# Patient Record
Sex: Female | Born: 1985
Health system: Southern US, Community
[De-identification: ages and names within clinical notes are randomized; demographics above are authoritative.]

## PROBLEM LIST (undated history)

## (undated) DIAGNOSIS — I10 Essential (primary) hypertension: Secondary | ICD-10-CM

---
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
## 2005-09-27 ENCOUNTER — Emergency Department (HOSPITAL_COMMUNITY): Admission: EM | Admit: 2005-09-27 | Discharge: 2005-09-27 | Payer: Self-pay | Admitting: Family Medicine

## 2005-10-02 ENCOUNTER — Encounter (INDEPENDENT_AMBULATORY_CARE_PROVIDER_SITE_OTHER): Payer: Self-pay | Admitting: *Deleted

## 2005-10-02 LAB — CONVERTED CEMR LAB

## 2005-10-18 ENCOUNTER — Encounter (INDEPENDENT_AMBULATORY_CARE_PROVIDER_SITE_OTHER): Payer: Self-pay | Admitting: *Deleted

## 2005-10-18 ENCOUNTER — Ambulatory Visit: Payer: Self-pay

## 2005-10-18 ENCOUNTER — Ambulatory Visit (HOSPITAL_COMMUNITY): Admission: RE | Admit: 2005-10-18 | Discharge: 2005-10-18 | Payer: Self-pay | Admitting: Family Medicine

## 2005-11-22 ENCOUNTER — Ambulatory Visit: Payer: Self-pay | Admitting: Family Medicine

## 2005-12-21 ENCOUNTER — Emergency Department (HOSPITAL_COMMUNITY): Admission: EM | Admit: 2005-12-21 | Discharge: 2005-12-21 | Payer: Self-pay | Admitting: Emergency Medicine

## 2006-01-10 ENCOUNTER — Emergency Department (HOSPITAL_COMMUNITY): Admission: EM | Admit: 2006-01-10 | Discharge: 2006-01-11 | Payer: Self-pay | Admitting: Emergency Medicine

## 2006-01-10 ENCOUNTER — Emergency Department (HOSPITAL_COMMUNITY): Admission: EM | Admit: 2006-01-10 | Discharge: 2006-01-10 | Payer: Self-pay | Admitting: Emergency Medicine

## 2006-01-18 ENCOUNTER — Emergency Department (HOSPITAL_COMMUNITY): Admission: EM | Admit: 2006-01-18 | Discharge: 2006-01-19 | Payer: Self-pay | Admitting: Emergency Medicine

## 2006-06-01 DIAGNOSIS — E669 Obesity, unspecified: Secondary | ICD-10-CM

## 2006-06-01 DIAGNOSIS — Z87891 Personal history of nicotine dependence: Secondary | ICD-10-CM

## 2006-06-01 DIAGNOSIS — L2089 Other atopic dermatitis: Secondary | ICD-10-CM | POA: Insufficient documentation

## 2006-06-02 ENCOUNTER — Encounter (INDEPENDENT_AMBULATORY_CARE_PROVIDER_SITE_OTHER): Payer: Self-pay | Admitting: *Deleted

## 2007-01-30 ENCOUNTER — Encounter: Payer: Self-pay | Admitting: *Deleted

## 2007-10-12 ENCOUNTER — Emergency Department (HOSPITAL_COMMUNITY): Admission: EM | Admit: 2007-10-12 | Discharge: 2007-10-12 | Payer: Self-pay | Admitting: Emergency Medicine

## 2008-09-11 ENCOUNTER — Emergency Department (HOSPITAL_COMMUNITY): Admission: EM | Admit: 2008-09-11 | Discharge: 2008-09-11 | Payer: Self-pay | Admitting: Emergency Medicine

## 2010-05-20 ENCOUNTER — Emergency Department (HOSPITAL_COMMUNITY)
Admission: EM | Admit: 2010-05-20 | Discharge: 2010-05-21 | Disposition: A | Discharge status: Home / Self Care | Payer: Self-pay | Attending: Emergency Medicine | Admitting: Emergency Medicine

## 2010-05-20 DIAGNOSIS — R630 Anorexia: Secondary | ICD-10-CM | POA: Insufficient documentation

## 2010-05-20 DIAGNOSIS — E669 Obesity, unspecified: Secondary | ICD-10-CM | POA: Insufficient documentation

## 2010-05-20 DIAGNOSIS — R51 Headache: Secondary | ICD-10-CM | POA: Insufficient documentation

## 2010-05-20 DIAGNOSIS — H53149 Visual discomfort, unspecified: Secondary | ICD-10-CM | POA: Insufficient documentation

## 2010-05-20 DIAGNOSIS — R42 Dizziness and giddiness: Secondary | ICD-10-CM | POA: Insufficient documentation

## 2010-05-20 DIAGNOSIS — R110 Nausea: Secondary | ICD-10-CM | POA: Insufficient documentation

## 2010-05-26 ENCOUNTER — Encounter: Payer: Self-pay | Admitting: *Deleted

## 2011-12-19 ENCOUNTER — Emergency Department (HOSPITAL_COMMUNITY): Payer: Self-pay

## 2011-12-19 ENCOUNTER — Encounter (HOSPITAL_COMMUNITY): Payer: Self-pay | Admitting: *Deleted

## 2011-12-19 ENCOUNTER — Emergency Department (HOSPITAL_COMMUNITY): Admission: EM | Admit: 2011-12-19 | Discharge: 2011-12-19 | Discharge status: Absent without leave | Payer: Self-pay

## 2011-12-19 ENCOUNTER — Emergency Department (HOSPITAL_COMMUNITY)
Admission: EM | Admit: 2011-12-19 | Discharge: 2011-12-19 | Disposition: A | Discharge status: Home / Self Care | Payer: Self-pay | Attending: Emergency Medicine | Admitting: Emergency Medicine

## 2011-12-19 DIAGNOSIS — J069 Acute upper respiratory infection, unspecified: Secondary | ICD-10-CM

## 2011-12-19 DIAGNOSIS — J189 Pneumonia, unspecified organism: Secondary | ICD-10-CM | POA: Insufficient documentation

## 2011-12-19 DIAGNOSIS — Z87891 Personal history of nicotine dependence: Secondary | ICD-10-CM | POA: Insufficient documentation

## 2011-12-19 MED ORDER — IPRATROPIUM BROMIDE 0.02 % IN SOLN
0.5000 mg | Freq: Once | RESPIRATORY_TRACT | Status: AC
  Administered 2011-12-19: 0.5 mg via RESPIRATORY_TRACT
  Filled 2011-12-19: qty 2.5

## 2011-12-19 MED ORDER — ALBUTEROL SULFATE HFA 108 (90 BASE) MCG/ACT IN AERS
2.0000 | INHALATION_SPRAY | RESPIRATORY_TRACT | Status: AC
  Administered 2011-12-19: 2 via RESPIRATORY_TRACT
  Filled 2011-12-19: qty 6.7

## 2011-12-19 MED ORDER — AZITHROMYCIN 250 MG PO TABS: ORAL_TABLET | ORAL | Status: DC

## 2011-12-19 MED ORDER — ALBUTEROL SULFATE (5 MG/ML) 0.5% IN NEBU
5.0000 mg | INHALATION_SOLUTION | Freq: Once | RESPIRATORY_TRACT | Status: AC
  Administered 2011-12-19: 5 mg via RESPIRATORY_TRACT
  Filled 2011-12-19: qty 1

## 2011-12-19 NOTE — ED Notes (Signed)
Did not answer x 1 per xray

## 2011-12-19 NOTE — ED Notes (Signed)
Pt reports headache, strong dry cough, and fevers since Friday. Denies sob. States she feels like she has been wheezing however, denies hx of asthma.

## 2011-12-19 NOTE — ED Notes (Signed)
Pt c/o headache, sore thorat since weekend, productive cough, cough more when lying flat, c/o chest pain, states "rattling" in ches

## 2011-12-19 NOTE — ED Provider Notes (Signed)
History  This chart was scribed for Laray Anger, DO by Shari Heritage. The patient was seen in room TR05C/TR05C. Patient's care was started at 1517.     CSN: [id number]  Arrival date & time 12/19/11  1310   First MD Initiated Contact with Patient 12/19/11 1517      Chief Complaint  Patient presents with  . Cough  . Headache  . Fever    The history is provided by the patient. No language interpreter was used.    Kathryn Butler is a 26 y.o. female who presents to the Emergency Department complaining of gradual onset and persistence of constant non-productive cough onset 2 days ago. There is associated nasal congestion, rhinorrhea, "wheezing," and subjective home fevers/chills. Patient says other people in her home are sick also.  Denies rash, no CP/SOB, no abd pain, no N/V/D.     History reviewed. No pertinent past medical history.  History reviewed. No pertinent past surgical history.   History  Substance Use Topics  . Smoking status: Former Games developer  . Smokeless tobacco: Not on file  . Alcohol Use: No      Review of Systems ROS: Statement: All systems negative except as marked or noted in the HPI; Constitutional: +chills. ; ; Eyes: Negative for eye pain, redness and discharge. ; ; ENMT: Negative for ear pain, hoarseness, sore throat. +runny nose, nasal congestion, sinus pressure. ; ; Cardiovascular: Negative for chest pain, palpitations, diaphoresis, dyspnea and peripheral edema. ; ; Respiratory: +cough, wheezing. Negative for stridor. ; ; Gastrointestinal: Negative for nausea, vomiting, diarrhea, abdominal pain, blood in stool, hematemesis, jaundice and rectal bleeding. . ; ; Genitourinary: Negative for dysuria, flank pain and hematuria. ; ; Musculoskeletal: Negative for back pain and neck pain. Negative for swelling and trauma.; ; Skin: Negative for pruritus, rash, abrasions, blisters, bruising and skin lesion.; ; Neuro: Negative for headache, lightheadedness and neck  stiffness. Negative for weakness, altered level of consciousness , altered mental status, extremity weakness, paresthesias, involuntary movement, seizure and syncope.       Allergies  Review of patient's allergies indicates no known allergies.  Home Medications   Current Outpatient Rx  Name Route Sig Dispense Refill  . AZITHROMYCIN 250 MG PO TABS  Take 2 tablets PO day one, then 1 tab PO daily for the next 4 days. 6 tablet 0    BP 147/103  Pulse 106  Temp 99.1 F (37.3 C)  Resp 18  SpO2 97%  LMP 12/04/2011  Physical Exam 1520: Physical examination:  Nursing notes reviewed; Vital signs and O2 SAT reviewed;  Constitutional: Well developed, Well nourished, Well hydrated, In no acute distress; Head:  Normocephalic, atraumatic; Eyes: EOMI, PERRL, No scleral icterus; ENMT: TM's clear bilat, +edemetous nasal turbinates bilat with clear rhinorrhea.  No hoarse voice, no drooling, no stridor. Mouth and pharynx normal, Mucous membranes moist; Neck: Supple, Full range of motion, No lymphadenopathy; Cardiovascular: Regular rate and rhythm, No murmur, rub, or gallop; Respiratory: Breath sounds coarse & equal bilaterally, scattered wheezes. No audible wheezing. Speaking full sentences with ease, Normal respiratory effort/excursion; Chest: Nontender, Movement normal; Abdomen: Soft, Nontender, Nondistended, Normal bowel sounds;; Extremities: Pulses normal, No tenderness, No edema, No calf edema or asymmetry.; Neuro: AA&Ox3, Major CN grossly intact.  Speech clear. No gross focal motor or sensory deficits in extremities.; Skin: Color normal, Warm, Dry.   ED Course  Procedures    MDM  MDM Reviewed: nursing note and vitals Interpretation: x-ray     Dg Chest  2 View 12/19/2011  *RADIOLOGY REPORT*  Clinical Data: Cough, fever.  CHEST - 2 VIEW  Comparison: None.  Findings: Heart size upper normal. Mild hypoaeration with interstitial and vascular crowding.  Lingular opacity on the lateral view has a  triangular configuration which raises possibility of superimposed artifact. Otherwise no focal consolidation, pleural effusion, or pneumothorax.  No acute osseous finding.  IMPRESSION: Opacity projecting over the lingula on the lateral view may be artifactual or early infiltrate.   Original Report Authenticated By: Waneta Martins, M.D.      [phone]:  Feels better after neb and wants to go home now.  Ambulatory around the ED with easy resps, lungs CTA bilat, Sats 97% R/A.  Will dose MDI here, rx zithomax for possible CAP.  Dx testing d/w pt and family.  Questions answered.  Verb understanding, agreeable to d/c home with outpt f/u.     I personally performed the services described in this documentation, which was scribed in my presence. The recorded information has been reviewed and considered. Seymour Pavlak Allison Quarry, DO 12/21/11 1308

## 2012-12-20 ENCOUNTER — Encounter (HOSPITAL_COMMUNITY): Payer: Self-pay

## 2012-12-20 ENCOUNTER — Emergency Department (HOSPITAL_COMMUNITY)
Admission: EM | Admit: 2012-12-20 | Discharge: 2012-12-20 | Disposition: A | Discharge status: Home / Self Care | Payer: Self-pay | Attending: Emergency Medicine | Admitting: Emergency Medicine

## 2012-12-20 DIAGNOSIS — Z87891 Personal history of nicotine dependence: Secondary | ICD-10-CM | POA: Insufficient documentation

## 2012-12-20 DIAGNOSIS — J020 Streptococcal pharyngitis: Secondary | ICD-10-CM | POA: Insufficient documentation

## 2012-12-20 DIAGNOSIS — R509 Fever, unspecified: Secondary | ICD-10-CM | POA: Insufficient documentation

## 2012-12-20 LAB — RAPID STREP SCREEN (MED CTR MEBANE ONLY): Streptococcus, Group A Screen (Direct): POSITIVE — AB

## 2012-12-20 MED ORDER — PENICILLIN G BENZATHINE 1200000 UNIT/2ML IM SUSP
1.2000 10*6.[IU] | Freq: Once | INTRAMUSCULAR | Status: AC
  Administered 2012-12-20: 1.2 10*6.[IU] via INTRAMUSCULAR
  Filled 2012-12-20: qty 2

## 2012-12-20 MED ORDER — DEXAMETHASONE 6 MG PO TABS
10.0000 mg | ORAL_TABLET | Freq: Once | ORAL | Status: AC
  Administered 2012-12-20: 10 mg via ORAL
  Filled 2012-12-20 (×3): qty 1

## 2012-12-20 MED ORDER — IBUPROFEN 800 MG PO TABS
800.0000 mg | ORAL_TABLET | Freq: Once | ORAL | Status: AC
  Administered 2012-12-20: 800 mg via ORAL
  Filled 2012-12-20: qty 1

## 2012-12-20 NOTE — ED Notes (Signed)
Pt ambulated without any assistance to the bathroom

## 2012-12-20 NOTE — ED Provider Notes (Signed)
CSN: [id number]     Arrival date & time 12/20/12  0815 History   First MD Initiated Contact with Patient 12/20/12 [phone]     Chief Complaint  Patient presents with  . Sore Throat   (Consider location/radiation/quality/duration/timing/severity/associated sxs/prior Treatment) Patient is a 27 y.o. female presenting with pharyngitis. The history is provided by the patient.  Sore Throat This is a new problem. The current episode started in the past 7 days. The problem occurs constantly. The problem has been gradually worsening. Associated symptoms include coughing (Rarely.  She reports one episode of coughing up blood-streaked mucus), a fever (subject) and a sore throat. Pertinent negatives include no abdominal pain, chest pain, chills, fatigue, headaches, nausea, neck pain, rash, swollen glands or vomiting. Treatments tried: Robitussin. The treatment provided mild relief.    History reviewed. No pertinent past medical history.  History reviewed. No pertinent past surgical history.  No family history on file. History  Substance Use Topics  . Smoking status: Former Games developer  . Smokeless tobacco: Not on file  . Alcohol Use: No   OB History   Grav Para Term Preterm Abortions TAB SAB Ect Mult Living                 Review of Systems  Constitutional: Positive for fever (subject). Negative for chills and fatigue.  HENT: Positive for sore throat. Negative for neck pain.   Eyes: Negative for pain.  Respiratory: Positive for cough (Rarely.  She reports one episode of coughing up blood-streaked mucus). Negative for chest tightness.   Cardiovascular: Negative for chest pain.  Gastrointestinal: Negative for nausea, vomiting and abdominal pain.  Genitourinary: Negative for dysuria, frequency, vaginal bleeding and vaginal discharge.  Musculoskeletal: Negative for back pain.  Skin: Negative for rash.  Neurological: Negative for headaches.  All other systems reviewed and are negative.    Allergies   Review of patient's allergies indicates no known allergies.  Home Medications   Current Outpatient Rx  Name  Route  Sig  Dispense  Refill  . guaifenesin (TUSSIN) 100 MG/5ML syrup   Oral   Take 200 mg by mouth 3 (three) times daily as needed for cough.          BP 131/74  Pulse 97  Temp(Src) 98.1 F (36.7 C) (Oral)  Resp 16  Ht 5\' 5"  (1.651 m)  Wt 180 lb (81.647 kg)  BMI 29.95 kg/m2  SpO2 100%  LMP 11/26/2012 Physical Exam  Vitals reviewed. Constitutional: She is oriented to person, place, and time. She appears well-developed and well-nourished. No distress.  HENT:  Right Ear: External ear normal.  Left Ear: External ear normal.  Mouth/Throat: No oropharyngeal exudate.  Posterior pharyngeal erythema, scant exudates  Eyes: Conjunctivae and EOM are normal. Pupils are equal, round, and reactive to light.  Neck: Normal range of motion. Neck supple.  Cardiovascular: Normal rate, regular rhythm, normal heart sounds and intact distal pulses.  Exam reveals no gallop and no friction rub.   No murmur heard. Pulmonary/Chest: Effort normal and breath sounds normal.  Abdominal: Soft. Bowel sounds are normal. She exhibits no distension. There is no tenderness.  Musculoskeletal: Normal range of motion. She exhibits no edema.  Lymphadenopathy:    She has no cervical adenopathy.  Neurological: She is alert and oriented to person, place, and time.  Skin: Skin is warm and dry. No rash noted.  Psychiatric: She has a normal mood and affect.    ED Course  Procedures (including critical care time) Labs  Review Labs Reviewed  RAPID STREP SCREEN - Abnormal; Notable for the following:    Streptococcus, Group A Screen (Direct) POSITIVE (*)    All other components within normal limits   Imaging Review No results found.  MDM   27 year female here here with cold symptoms for a week and a main complaint of worsening sore throat. She tried Robitussin with minimal relief. She endorses pain  with swallowing, but is able to swallow easily. She also had one episode of coughing up mucous that was streaked with a little bit of blood. She has had multiple sick contacts. She endorses subjective fevers, but none confirmed. She's also had a couple episodes of loose stools. She is afebrile and well appearing. Exam is as documented above. Full and unrestricted active range of motion of her neck.  Given her myriad of symptoms is felt this is likely a viral process. Centor 2/4 for no cough and scant exudates.  No signs of PTA or retropharyngeal abscess.  Will swab for strep. Motrin, decadron, reassess.  12:25 PM Strep swab was positive. Bicillin given.  Supportive care discussed. Return precautions reviewed. All questions were answered.  Clinical Impression: 1. Strep pharyngitis     Disposition: Discharge  Condition: Good  I have discussed the results, Dx and Tx plan. They expressed understanding and agree with the plan and were told to return to ED with any worsening of condition or concern.    Discharge Medication List as of 12/20/2012 11:35 AM      Follow Up: Mirna Mires, MD [address] [place] [address] [phone]  Schedule an appointment as soon as possible for a visit    Pt seen in conjunction with Dr. Oletta Lamas.  Reine Just. Beverely Pace, MD Emergency Medicine PGY-III [phone]    Oleh Genin, MD 12/20/12 [phone]

## 2012-12-20 NOTE — ED Notes (Signed)
Pt here for sore throat since last Wednesday. Denies any fever. Red throat enlarged tonsils. Has tried multiple OTC remedies without relief.

## 2012-12-22 NOTE — ED Provider Notes (Signed)
I saw and evaluated the patient, reviewed the resident's note and I agree with the findings and plan.  Subjective fever at home, sore throat.  No sig lymphadenopathy.  No sig coughing.  Hoarse voice, pain with swallowing and speaking . Strep screen is positive.  Protecting airway, no stridor, wheezing.  Will gvie abx, steroids for pain and swelling, safe for d/c home.       Gavin Pound. Ardella Chhim, MD 12/22/12 1478

## 2014-09-24 ENCOUNTER — Emergency Department (HOSPITAL_COMMUNITY): Payer: Self-pay

## 2014-09-24 ENCOUNTER — Emergency Department (HOSPITAL_COMMUNITY)
Admission: EM | Admit: 2014-09-24 | Discharge: 2014-09-24 | Disposition: A | Discharge status: Home / Self Care | Payer: Self-pay | Attending: Emergency Medicine | Admitting: Emergency Medicine

## 2014-09-24 ENCOUNTER — Encounter (HOSPITAL_COMMUNITY): Payer: Self-pay | Admitting: *Deleted

## 2014-09-24 DIAGNOSIS — W01198A Fall on same level from slipping, tripping and stumbling with subsequent striking against other object, initial encounter: Secondary | ICD-10-CM | POA: Insufficient documentation

## 2014-09-24 DIAGNOSIS — Y9389 Activity, other specified: Secondary | ICD-10-CM | POA: Insufficient documentation

## 2014-09-24 DIAGNOSIS — Z87891 Personal history of nicotine dependence: Secondary | ICD-10-CM | POA: Insufficient documentation

## 2014-09-24 DIAGNOSIS — Y9289 Other specified places as the place of occurrence of the external cause: Secondary | ICD-10-CM | POA: Insufficient documentation

## 2014-09-24 DIAGNOSIS — Y998 Other external cause status: Secondary | ICD-10-CM | POA: Insufficient documentation

## 2014-09-24 DIAGNOSIS — L03115 Cellulitis of right lower limb: Secondary | ICD-10-CM | POA: Insufficient documentation

## 2014-09-24 MED ORDER — IBUPROFEN 800 MG PO TABS: 800.0000 mg | ORAL_TABLET | Freq: Three times a day (TID) | ORAL | Status: DC

## 2014-09-24 MED ORDER — CEPHALEXIN 500 MG PO CAPS: 500.0000 mg | ORAL_CAPSULE | Freq: Four times a day (QID) | ORAL | Status: DC

## 2014-09-24 NOTE — ED Notes (Signed)
Declined W/C at D/C and was escorted to lobby by RN. 

## 2014-09-24 NOTE — ED Provider Notes (Signed)
CSN: [id number]     Arrival date & time 09/24/14  5102 History   First MD Initiated Contact with Patient 09/24/14 0845    This chart was scribed for non-physician practitioner, Danelle Berry, PA-C, working with Derwood Kaplan, MD by Marica Otter, ED Scribe. This patient was seen in room TR06C/TR06C and the patient's care was started at 9:18 AM.  Chief Complaint  Patient presents with  . Leg Injury   The history is provided by the patient. No language interpreter was used.   PCP: Evlyn Courier, MD HPI Comments: Kathryn Butler is a 29 y.o. female, otherwise healthy, who presents to the Emergency Department complaining of one day of increased redness and pain surrounding a right shin contusion that occurred 2 weeks ago.  She has had good healing and decreased swelling since first striking her shin, but over the last day there has been new redness and pain, rated, 8/10, with some associated tingling in the surrounding bruised skin.   Pt denies any radiating pain, warmth of area, discharge/exudate or numbness. Pt reports her Sx improved after she iced the affected area She denies any daily medication, has no known allergies, denies any fever, chills, diaphoresis, gait problems, headaches, dizziness, SOB, Hx of elevated BP, aching/cramping of legs with walking long distances.      History reviewed. No pertinent past medical history. History reviewed. No pertinent past surgical history. History reviewed. No pertinent family history. History  Substance Use Topics  . Smoking status: Former Games developer  . Smokeless tobacco: Not on file  . Alcohol Use: No   OB History    No data available     Review of Systems  Constitutional: Negative for fever, chills and diaphoresis.  Respiratory: Negative for shortness of breath.   Musculoskeletal: Negative for gait problem.       Pain to right shin with associated swelling      Neurological: Negative for dizziness, numbness and headaches.    Allergies   Review of patient's allergies indicates no known allergies.  Home Medications   Prior to Admission medications   Medication Sig Start Date End Date Taking? Authorizing Provider  guaifenesin (TUSSIN) 100 MG/5ML syrup Take 200 mg by mouth 3 (three) times daily as needed for cough.    Historical Provider, MD   Triage Vitals: BP 171/95 mmHg  Pulse 76  Temp(Src) 98.1 F (36.7 C) (Oral)  Resp 16  SpO2 98%  LMP 09/16/2014 Physical Exam  Constitutional: She is oriented to person, place, and time. She appears well-developed and well-nourished. No distress.  HENT:  Head: Normocephalic and atraumatic.  Eyes: Conjunctivae and EOM are normal.  Neck: Neck supple.  Cardiovascular: Normal rate.   Pulmonary/Chest: Effort normal. No respiratory distress.  Musculoskeletal: Normal range of motion. She exhibits edema.  10cm x 10cm surrounding erythema and induration surrounding right shin. Healing ecchymosis to right shin with healing contusions. Hyper pigmented scars to BLE, but no ulcerations noted. 1+ pulses in posterior tibialis and DP. Toes 2-5 thickened nails, dry skin. No bony tenderness. No eschar.    Neurological: She is alert and oriented to person, place, and time.  Skin: Skin is warm and dry.  Psychiatric: She has a normal mood and affect. Her behavior is normal.  Nursing note and vitals reviewed.   ED Course  Procedures (including critical care time) DIAGNOSTIC STUDIES: Oxygen Saturation is 98% on RA, nl by my interpretation.    COORDINATION OF CARE: 9:26 AM-Discussed treatment plan which includes antibiotics, continuing regular  activity, and work note with pt at bedside and pt agreed to plan. Pt advised to return to the ED immediately if Sx worsen.    Labs Review Labs Reviewed - No data to display  Imaging Review Dg Tibia/fibula Right  09/24/2014   CLINICAL DATA:  Fall getting out of pool. Hit leg on concrete edge. Bruising.  EXAM: RIGHT TIBIA AND FIBULA - 2 VIEW  COMPARISON:   None.  FINDINGS: Mild anterior soft tissue swelling in the distal right calf. No underlying bony abnormality. No fracture, subluxation or dislocation.  IMPRESSION: No acute bony abnormality.   Electronically Signed   By: Charlett Nose M.D.   On: 09/24/2014 09:14     EKG Interpretation None      MDM   Final diagnoses:  None    Patient with left lower extremity edema and increased pain for 1 day, status post fall and contusion nearly 2 weeks ago.  No fever, chills, tachycardia here in the ER.  X-ray of shin done which is negative except for soft tissue swelling.  Clinically, patient appears to have poor lower extremity circulation bilaterally. Old healing contusions and bruises, but no area of fluctuance, no ulceration or ecshar suggestive of MRSA infection Given that the patient does not have any systemic symptoms at this time, but has new localized erythema, will treat as a cellulitis with oral Keflex. Patient given strict return precautions.  Patient noted to be initially hypertensive with triage vitals, denies any treatment of hypertension, repeat vitals are mildly hypertensive, but improved.  She does not have any symptoms of hypertensive urgency, no chest pain no shortness of breath, no headache or visual changes.  Patient stable to discharge at this time  I personally performed the services described in this documentation, which was scribed in my presence. The recorded information has been reviewed and is accurate.    Danelle Berry, PA-C 09/26/14 1301  Derwood Kaplan, MD 09/26/14 4098

## 2014-09-24 NOTE — ED Notes (Signed)
Pt in stating she was getting out of a pool two weeks ago and fell and hit her right shin on a concrete ledge, bruising noted, states bruising has continued since that time with pain, no distress noted

## 2014-09-24 NOTE — Discharge Instructions (Signed)

## 2015-04-20 ENCOUNTER — Encounter (HOSPITAL_COMMUNITY): Payer: Self-pay

## 2015-04-20 ENCOUNTER — Emergency Department (HOSPITAL_COMMUNITY)
Admission: EM | Admit: 2015-04-20 | Discharge: 2015-04-20 | Disposition: A | Discharge status: Home / Self Care | Payer: Self-pay | Attending: Emergency Medicine | Admitting: Emergency Medicine

## 2015-04-20 DIAGNOSIS — Y9289 Other specified places as the place of occurrence of the external cause: Secondary | ICD-10-CM | POA: Insufficient documentation

## 2015-04-20 DIAGNOSIS — Z87891 Personal history of nicotine dependence: Secondary | ICD-10-CM | POA: Insufficient documentation

## 2015-04-20 DIAGNOSIS — W540XXA Bitten by dog, initial encounter: Secondary | ICD-10-CM | POA: Insufficient documentation

## 2015-04-20 DIAGNOSIS — Z791 Long term (current) use of non-steroidal anti-inflammatories (NSAID): Secondary | ICD-10-CM | POA: Insufficient documentation

## 2015-04-20 DIAGNOSIS — Z792 Long term (current) use of antibiotics: Secondary | ICD-10-CM | POA: Insufficient documentation

## 2015-04-20 DIAGNOSIS — S71131A Puncture wound without foreign body, right thigh, initial encounter: Secondary | ICD-10-CM | POA: Insufficient documentation

## 2015-04-20 DIAGNOSIS — Z23 Encounter for immunization: Secondary | ICD-10-CM | POA: Insufficient documentation

## 2015-04-20 DIAGNOSIS — Y9389 Activity, other specified: Secondary | ICD-10-CM | POA: Insufficient documentation

## 2015-04-20 DIAGNOSIS — Y998 Other external cause status: Secondary | ICD-10-CM | POA: Insufficient documentation

## 2015-04-20 MED ORDER — HYDROCODONE-ACETAMINOPHEN 5-325 MG PO TABS
1.0000 | ORAL_TABLET | Freq: Once | ORAL | Status: AC
  Administered 2015-04-20: 1 via ORAL
  Filled 2015-04-20: qty 1

## 2015-04-20 MED ORDER — ONDANSETRON 4 MG PO TBDP
4.0000 mg | ORAL_TABLET | Freq: Once | ORAL | Status: AC
  Administered 2015-04-20: 4 mg via ORAL
  Filled 2015-04-20: qty 1

## 2015-04-20 MED ORDER — AMOXICILLIN-POT CLAVULANATE 875-125 MG PO TABS
1.0000 | ORAL_TABLET | Freq: Once | ORAL | Status: AC
  Administered 2015-04-20: 1 via ORAL
  Filled 2015-04-20: qty 1

## 2015-04-20 MED ORDER — AMOXICILLIN-POT CLAVULANATE 875-125 MG PO TABS: 1.0000 | ORAL_TABLET | Freq: Two times a day (BID) | ORAL | Status: DC

## 2015-04-20 MED ORDER — TETANUS-DIPHTH-ACELL PERTUSSIS 5-2.5-18.5 LF-MCG/0.5 IM SUSP
0.5000 mL | Freq: Once | INTRAMUSCULAR | Status: AC
  Administered 2015-04-20: 0.5 mL via INTRAMUSCULAR
  Filled 2015-04-20: qty 0.5

## 2015-04-20 MED ORDER — HYDROCODONE-ACETAMINOPHEN 5-325 MG PO TABS: ORAL_TABLET | ORAL | Status: DC

## 2015-04-20 NOTE — ED Notes (Signed)
PT reports

## 2015-04-20 NOTE — ED Notes (Signed)
Declined W/C at D/C and was escorted to lobby by RN. 

## 2015-04-20 NOTE — ED Provider Notes (Signed)
CSN: [id number]     Arrival date & time 04/20/15  0514 History   First MD Initiated Contact with Patient 04/20/15 [phone]     Chief Complaint  Patient presents with  . Animal Bite     (Consider location/radiation/quality/duration/timing/severity/associated sxs/prior Treatment) HPI  Blood pressure 149/110, pulse 69, temperature 97.8 F (36.6 C), temperature source Oral, resp. rate 20, height 5\' 1"  (1.549 m), last menstrual period 04/19/2015, SpO2 96 %.  Kathryn Butler is a 30 y.o. female who is otherwise healthy presenting with a heart bite to right anterior thigh onset yesterday at 5 PM. Patient was visiting a friend yesterday she was outside of his house the dog somehow went through a metal gate and charged her biting her thigh. States that there was no inciting incident. Owners came out of the house after she screamed and were able to control the dog with verbal commands. Last tetanus shot is unknown. Patient applied peroxide to the area she been taking multiple over-the-counter medications with little relief. She denies diabetes, immunosuppression or chronic steroid use. Patient rates her pain at 7 out of 10 at exacerbated by movement and palpation, it kept her from sleep last night.  History reviewed. No pertinent past medical history. History reviewed. No pertinent past surgical history. No family history on file. Social History  Substance Use Topics  . Smoking status: Former Games developermoker  . Smokeless tobacco: None  . Alcohol Use: No   OB History    No data available     Review of Systems   10 systems reviewed and found to be negative, except as noted in the HPI.  Allergies  Review of patient's allergies indicates no known allergies.  Home Medications   Prior to Admission medications   Medication Sig Start Date End Date Taking? Authorizing Provider  amoxicillin-clavulanate (AUGMENTIN) 875-125 MG tablet Take 1 tablet by mouth 2 (two) times daily. One po bid x 7 days 04/20/15    Joni ReiningNicole Nikai Quest, PA-C  cephALEXin (KEFLEX) 500 MG capsule Take 1 capsule (500 mg total) by mouth 4 (four) times daily. 09/24/14   Danelle BerryLeisa Tapia, PA-C  guaifenesin (TUSSIN) 100 MG/5ML syrup Take 200 mg by mouth 3 (three) times daily as needed for cough.    Historical Provider, MD  HYDROcodone-acetaminophen (NORCO/VICODIN) 5-325 MG tablet Take 1-2 tablets by mouth every 6 hours as needed for pain and/or cough. 04/20/15   Sair Faulcon, PA-C  ibuprofen (ADVIL,MOTRIN) 800 MG tablet Take 1 tablet (800 mg total) by mouth 3 (three) times daily. 09/24/14   Danelle BerryLeisa Tapia, PA-C   BP 149/110 mmHg  Pulse 69  Temp(Src) 97.8 F (36.6 C) (Oral)  Resp 20  Ht 5\' 1"  (1.549 m)  SpO2 96%  LMP 04/19/2015 Physical Exam  Constitutional: She is oriented to person, place, and time. She appears well-developed and well-nourished. No distress.  HENT:  Head: Normocephalic.  Eyes: Conjunctivae and EOM are normal.  Cardiovascular: Normal rate.   Pulmonary/Chest: Effort normal. No stridor.  Musculoskeletal: Normal range of motion. She exhibits tenderness.  Ecchymoses, tenderness and puncture wounds consistent with dog bite, no overt discharge, warmth, tenderness palpation.  Neurological: She is alert and oriented to person, place, and time.  Psychiatric: She has a normal mood and affect.  Nursing note and vitals reviewed.       ED Course  Irrigation Date/Time: 04/20/2015 7:56 AM Performed by: Wynetta EmeryPISCIOTTA, Durant Scibilia Authorized by: Wynetta EmeryPISCIOTTA, Kiowa Hollar Consent: Verbal consent obtained. Risks and benefits: risks, benefits and alternatives were discussed Consent given by:  patient Patient identity confirmed: verbally with patient Preparation: Patient was prepped and draped in the usual sterile fashion. Local anesthesia used: no Patient sedated: no Patient tolerance: Patient tolerated the procedure well with no immediate complications Comments: Wounds irrigated under low pressure and dressed in bacitracin   (including  critical care time) Labs Review Labs Reviewed - No data to display  Imaging Review No results found. I have personally reviewed and evaluated these images and lab results as part of my medical decision-making.   EKG Interpretation None          MDM   Final diagnoses:  Puncture wound of thigh, right, initial encounter  Dog bite    Filed Vitals:   04/20/15 0519 04/20/15 0840  BP: 149/110 138/71  Pulse: 69 73  Temp: 97.8 F (36.6 C) 98 F (36.7 C)  TempSrc: Oral Oral  Resp: 20 18  Height: 5\' 1"  (1.549 m)   SpO2: 96% 97%    Medications  Tdap (BOOSTRIX) injection 0.5 mL (0.5 mLs Intramuscular Given 04/20/15 0745)  amoxicillin-clavulanate (AUGMENTIN) 875-125 MG per tablet 1 tablet (1 tablet Oral Given 04/20/15 0744)  HYDROcodone-acetaminophen (NORCO/VICODIN) 5-325 MG per tablet 1 tablet (1 tablet Oral Given 04/20/15 0745)  ondansetron (ZOFRAN-ODT) disintegrating tablet 4 mg (4 mg Oral Given 04/20/15 0744)    Kathryn Butler is 30 y.o. female presenting with large dog bite to right thigh. Patient's tetanus is updated, we have started her on Augmentin and irrigated the wound. We are getting verification from the dogs that [name] that patient's rabies vaccinations are up-to-date. Dog bite form is submitted to local authorities.   Received confirmation that dog had rabies vaccination. Wounds are irrigated and dressed and patient is counseled on wound care and return precautions.   Evaluation does not show pathology that would require ongoing emergent intervention or inpatient treatment. Pt is hemodynamically stable and mentating appropriately. Discussed findings and plan with patient/guardian, who agrees with care plan. All questions answered. Return precautions discussed and outpatient follow up given.   New Prescriptions   AMOXICILLIN-CLAVULANATE (AUGMENTIN) 875-125 MG TABLET    Take 1 tablet by mouth 2 (two) times daily. One po bid x 7 days   HYDROCODONE-ACETAMINOPHEN  (NORCO/VICODIN) 5-325 MG TABLET    Take 1-2 tablets by mouth every 6 hours as needed for pain and/or cough.         Wynetta Emery, PA-C 04/20/15 0829  Wynetta Emery, PA-C 04/20/15 1610  Marily Memos, MD 04/22/15 [phone]

## 2015-04-20 NOTE — Discharge Instructions (Signed)
For pain control please take ibuprofen (also known as Motrin or Advil) 800mg  (this is normally 4 over the counter pills) 3 times a day  for 5 days. Take with food to minimize stomach irritation. Take vicodin for breakthrough pain, do not drink alcohol, drive, care for children or do other critical tasks while taking vicodin. Apply ice to the area in the first 48 hours to help reduce swelling and pain.  Wash the affected area with soap and water and apply a thin layer of topical antibiotic ointment. Do this every 12 hours.   Do not use rubbing alcohol or hydrogen peroxide.                        Look for signs of infection: if you see redness, if the area becomes warm, if pain increases sharply, there is discharge (pus), if red streaks appear or you develop fever or vomiting, RETURN immediately to the Emergency Department  for a recheck.   Take your antibiotics as directed and to completion. You should never have any leftover antibiotics! Push fluids and stay well hydrated.   Any antibiotic use can reduce the efficacy of hormonal birth control. Please use back up method of contraception.   Please follow with your primary care doctor in the next 2 days for a check-up. They must obtain records for further management.   Do not hesitate to return to the Emergency Department for any new, worsening or concerning symptoms.     Mechanical Wound Debridement, Care After Refer to this sheet in the next few weeks. These instructions provide you with information about caring for yourself after your procedure. Your health care provider may also give you more specific instructions. Your treatment has been planned according to current medical practices, but problems sometimes occur. Call your health care provider if you have any problems or questions after your procedure.  WHAT TO EXPECT AFTER THE PROCEDURE: After your procedure, it is common to have:  Soreness or pain.  Light  bleeding.  Tightness.  Skin irritation. HOME CARE INSTRUCTIONS Medicines  Take over-the-counter and prescription medicines only as told by your health care provider.  If you were prescribed an antibiotic medicine, take it or apply it as told by your health care provider. Do not stop taking or using the antibiotic even if your condition improves. Wound Care  Follow instructions from your health care provider about:  How to take care of your wound.  When and how you should change your dressing. If your dressing is dry and stuck when you try to remove it, moisten or wet the dressing with saline or water so that it can be removed without harming your skin or wound.  When you should remove your dressing.  Check your wound every day for signs of infection. Watch for:  More redness or swelling.  More fluid or pus.  A bad smell.  More pain, bleeding, or warmth around the wound. General Instructions  Eat a healthy diet with lots of protein. Ask your health care provider to suggest the best diet for you.  Do not smoke. Smoking makes it harder for your body to heal.  Keep all follow-up visits as told by your health care provider. This is important.  Do not take baths, swim, or use a hot tub until your health care provider approves.   Ask your health care provider what activities are safe for you.  SEEK MEDICAL CARE IF:  You have a  fever.  Your pain medicine is not helping.   Your wound is red and swollen.   You have more bleeding or fluid coming from the wound.  You have pus coming from your wound after cleaning it.  You have a bad smell coming from your wound after cleaning it.  Your wound is not getting better within 1-2 weeks of treatment.  You develop a new medical condition, such as diabetes, peripheral vascular disease, or a condition that affects your defense (immune) system.   This information is not intended to replace advice given to you by your health  care provider. Make sure you discuss any questions you have with your health care provider.   Document Released: 12/10/2014 Document Reviewed: 07/30/2014 Elsevier Interactive Patient Education [employer]2016 Elsevier Inc.

## 2015-04-20 NOTE — ED Notes (Signed)
Call to [facility] Princella IonJames Oliver DVM [phone] . PET name Kathryn Butler . [facility] staff to fax over Rabies vac. Information.

## 2015-04-20 NOTE — ED Notes (Signed)
Pt states she was bit yesterday by a friend's german shepard puppy to the right thigh. States friend said they were up to date on shots. Unknown last tetanus.

## 2015-04-20 NOTE — ED Notes (Signed)
Pt states it happened [place] [address] [place]

## 2015-10-24 ENCOUNTER — Emergency Department (HOSPITAL_COMMUNITY)
Admission: EM | Admit: 2015-10-24 | Discharge: 2015-10-24 | Disposition: A | Discharge status: Home / Self Care | Payer: Self-pay | Attending: Emergency Medicine | Admitting: Emergency Medicine

## 2015-10-24 ENCOUNTER — Encounter (HOSPITAL_COMMUNITY): Payer: Self-pay | Admitting: Emergency Medicine

## 2015-10-24 DIAGNOSIS — R519 Headache, unspecified: Secondary | ICD-10-CM

## 2015-10-24 DIAGNOSIS — R51 Headache: Secondary | ICD-10-CM | POA: Insufficient documentation

## 2015-10-24 DIAGNOSIS — Z87891 Personal history of nicotine dependence: Secondary | ICD-10-CM | POA: Insufficient documentation

## 2015-10-24 LAB — CBC WITH DIFFERENTIAL/PLATELET
BASOS ABS: 0 10*3/uL (ref 0.0–0.1)
Basophils Relative: 0 %
EOS PCT: 4 %
Eosinophils Absolute: 0.2 10*3/uL (ref 0.0–0.7)
HCT: 38.7 % (ref 36.0–46.0)
Hemoglobin: 12.4 g/dL (ref 12.0–15.0)
LYMPHS ABS: 2.1 10*3/uL (ref 0.7–4.0)
LYMPHS PCT: 33 %
MCH: 26.4 pg (ref 26.0–34.0)
MCHC: 32 g/dL (ref 30.0–36.0)
MCV: 82.5 fL (ref 78.0–100.0)
MONO ABS: 0.9 10*3/uL (ref 0.1–1.0)
Monocytes Relative: 14 %
Neutro Abs: 3.1 10*3/uL (ref 1.7–7.7)
Neutrophils Relative %: 49 %
PLATELETS: 418 10*3/uL — AB (ref 150–400)
RBC: 4.69 MIL/uL (ref 3.87–5.11)
RDW: 15.5 % (ref 11.5–15.5)
WBC: 6.3 10*3/uL (ref 4.0–10.5)

## 2015-10-24 LAB — BASIC METABOLIC PANEL
Anion gap: 8 (ref 5–15)
BUN: 12 mg/dL (ref 6–20)
CO2: 27 mmol/L (ref 22–32)
Calcium: 9.2 mg/dL (ref 8.9–10.3)
Chloride: 102 mmol/L (ref 101–111)
Creatinine, Ser: 0.91 mg/dL (ref 0.44–1.00)
GFR calc Af Amer: >60 mL/min (ref >60)
GLUCOSE: 86 mg/dL (ref 65–99)
POTASSIUM: 3.5 mmol/L (ref 3.5–5.1)
Sodium: 137 mmol/L (ref 135–145)

## 2015-10-24 LAB — I-STAT BETA HCG BLOOD, ED (MC, WL, AP ONLY): I-stat hCG, quantitative: <5 m[IU]/mL (ref <5)

## 2015-10-24 MED ORDER — BUTALBITAL-APAP-CAFFEINE 50-325-40 MG PO TABS: 1.0000 | ORAL_TABLET | Freq: Four times a day (QID) | ORAL | Status: AC | PRN

## 2015-10-24 MED ORDER — KETOROLAC TROMETHAMINE 30 MG/ML IJ SOLN
30.0000 mg | Freq: Once | INTRAMUSCULAR | Status: AC
  Administered 2015-10-24: 30 mg via INTRAVENOUS
  Filled 2015-10-24: qty 1

## 2015-10-24 MED ORDER — DEXAMETHASONE SODIUM PHOSPHATE 10 MG/ML IJ SOLN
10.0000 mg | Freq: Once | INTRAMUSCULAR | Status: AC
  Administered 2015-10-24: 10 mg via INTRAVENOUS
  Filled 2015-10-24: qty 1

## 2015-10-24 MED ORDER — SODIUM CHLORIDE 0.9 % IV BOLUS (SEPSIS)
1000.0000 mL | Freq: Once | INTRAVENOUS | Status: AC
  Administered 2015-10-24: 1000 mL via INTRAVENOUS

## 2015-10-24 MED ORDER — METOCLOPRAMIDE HCL 5 MG/ML IJ SOLN
10.0000 mg | Freq: Once | INTRAMUSCULAR | Status: AC
  Administered 2015-10-24: 10 mg via INTRAVENOUS
  Filled 2015-10-24: qty 2

## 2015-10-24 MED ORDER — DIPHENHYDRAMINE HCL 50 MG/ML IJ SOLN
12.5000 mg | Freq: Once | INTRAMUSCULAR | Status: AC
  Administered 2015-10-24: 12.5 mg via INTRAVENOUS
  Filled 2015-10-24: qty 1

## 2015-10-24 NOTE — ED Notes (Signed)
Pt A&OX4, ambulatory at d/c with steady gait, NAD and states she feels much better. 

## 2015-10-24 NOTE — ED Provider Notes (Signed)
CSN: [id number]     Arrival date & time 10/24/15  0508 History   First MD Initiated Contact with Patient 10/24/15 0615     Chief Complaint  Patient presents with  . Headache     (Consider location/radiation/quality/duration/timing/severity/associated sxs/prior Treatment) HPI Kathryn Butler is a 30 y.o. female with no significant PMH who presents for 1 day history of gradual onset, constant, unchanging bilateral temporal headache unrelieved by 800 mg ibuprofen.  Similar to prior headaches.  No head injury/trauma. No modifying factors.  Endorses mild nausea and sonophobia. Denies fever, chills, visual disturbance, numbness, weakness, neck stiffness, vomiting, photophobia, gait abnormalities.  No modifying factors.   History reviewed. No pertinent past medical history. History reviewed. No pertinent past surgical history. No family history on file. Social History  Substance Use Topics  . Smoking status: Former Games developer  . Smokeless tobacco: None  . Alcohol Use: No   OB History    No data available     Review of Systems All other systems negative unless otherwise stated in HPI    Allergies  Review of patient's allergies indicates no known allergies.  Home Medications   Prior to Admission medications   Medication Sig Start Date End Date Taking? Authorizing Provider  butalbital-acetaminophen-caffeine (FIORICET) 50-325-40 MG tablet Take 1 tablet by mouth every 6 (six) hours as needed for headache. 10/24/15 10/23/16  Cheri Fowler, PA-C   BP 120/99 mmHg  Pulse 67  Temp(Src) 97.8 F (36.6 C) (Oral)  Resp 18  SpO2 100%  LMP 10/17/2015 Physical Exam  Constitutional: She is oriented to person, place, and time. She appears well-developed and well-nourished.  Non-toxic appearance. She does not have a sickly appearance. She does not appear ill.  HENT:  Head: Normocephalic and atraumatic.  Mouth/Throat: Oropharynx is clear and moist.  Eyes: Conjunctivae are normal. Pupils are equal,  round, and reactive to light.  Neck: Normal range of motion. Neck supple.  Cardiovascular: Normal rate and regular rhythm.   Pulmonary/Chest: Effort normal and breath sounds normal. No accessory muscle usage or stridor. No respiratory distress. She has no wheezes. She has no rhonchi. She has no rales.  Abdominal: Soft. Bowel sounds are normal. She exhibits no distension. There is no tenderness.  Musculoskeletal: Normal range of motion.  Lymphadenopathy:    She has no cervical adenopathy.  Neurological: She is alert and oriented to person, place, and time.  Mental Status:   AOx3.  Speech clear without dysarthria. Cranial Nerves:  I-not tested  II-PERRLA  III, IV, VI-EOMs intact  V-temporal and masseter strength intact  VII-symmetrical facial movements intact, no facial droop  VIII-hearing grossly intact bilaterally  IX, X-gag intact  XI-strength of sternomastoid and trapezius muscles 5/5  XII-tongue midline Motor:   Good muscle bulk and tone  Strength 5/5 bilaterally in upper and lower extremities   Cerebellar--intact RAMs, finger to nose intact bilaterally.    No pronator drift Sensory:  Intact in upper and lower extremities   Skin: Skin is warm and dry.  Psychiatric: She has a normal mood and affect. Her behavior is normal.     ED Course  Procedures (including critical care time) Labs Review Labs Reviewed  CBC WITH DIFFERENTIAL/PLATELET - Abnormal; Notable for the following:    Platelets 418 (*)    All other components within normal limits  BASIC METABOLIC PANEL  I-STAT BETA HCG BLOOD, ED ([facility], [facility], [facility] ONLY)    Imaging Review No results found. I have personally reviewed and evaluated these images  and lab results as part of my medical decision-making.   EKG Interpretation None      MDM   Final diagnoses:  Nonintractable headache, unspecified chronicity pattern, unspecified headache type   Patient presents with bilateral temporal headache similar to previous  headaches.  No head injury/trauma.  No fever, chills, visual disturbance, numbness, weakness, gait abnormalities.  Normal neurological exam.  VSS, NAD.  Doubt SAH, NPH, meningitis, or intracranial mass.  Will give migraine cocktail and reassess.  Labs without acute abnormalities.  Patient reports resolution of headache.  Suspect primary headache.  Discharge home with Fioricet.  Follow up PCP.  Return precautions discussed.  Patient agrees and acknowledges the above plan for discharge.         Cheri Fowler, PA-C 10/24/15 5784   Melene Plan, DO 10/25/15 0700

## 2015-10-24 NOTE — ED Notes (Signed)
Pt independently ambulated to restroom with steady gait, NAD 

## 2015-10-24 NOTE — Discharge Instructions (Signed)
Tension Headache A tension headache is a feeling of pain, pressure, or aching that is often felt over the front and sides of the head. The pain can be dull, or it can feel tight (constricting). Tension headaches are not normally associated with nausea or vomiting, and they do not get worse with physical activity. Tension headaches can last from 30 minutes to several days. This is the most common type of headache. CAUSES The exact cause of this condition is not known. Tension headaches often begin after stress, anxiety, or depression. Other triggers may include:  Alcohol.  Too much caffeine, or caffeine withdrawal.  Respiratory infections, such as colds, flu, or sinus infections.  Dental problems or teeth clenching.  Fatigue.  Holding your head and neck in the same position for a long period of time, such as while using a computer.  Smoking. SYMPTOMS Symptoms of this condition include:  A feeling of pressure around the head.  Dull, aching head pain.  Pain felt over the front and sides of the head.  Tenderness in the muscles of the head, neck, and shoulders. DIAGNOSIS This condition may be diagnosed based on your symptoms and a physical exam. Tests may be done, such as a CT scan or an MRI of your head. These tests may be done if your symptoms are severe or unusual. TREATMENT This condition may be treated with lifestyle changes and medicines to help relieve symptoms. HOME CARE INSTRUCTIONS Managing Pain  Take over-the-counter and prescription medicines only as told by your health care provider.  Lie down in a dark, quiet room when you have a headache.  If directed, apply ice to the head and neck area:  Put ice in a plastic bag.  Place a towel between your skin and the bag.  Leave the ice on for 20 minutes, 2-3 times per day.  Use a heating pad or a hot shower to apply heat to the head and neck area as told by your health care provider. Eating and Drinking  Eat meals on  a regular schedule.  Limit alcohol use.  Decrease your caffeine intake, or stop using caffeine. General Instructions  Keep all follow-up visits as told by your health care provider. This is important.  Keep a headache journal to help find out what may trigger your headaches. For example, write down:  What you eat and drink.  How much sleep you get.  Any change to your diet or medicines.  Try massage or other relaxation techniques.  Limit stress.  Sit up straight, and avoid tensing your muscles.  Do not use tobacco products, including cigarettes, chewing tobacco, or e-cigarettes. If you need help quitting, ask your health care provider.  Exercise regularly as told by your health care provider.  Get 7-9 hours of sleep, or the amount recommended by your health care provider. SEEK MEDICAL CARE IF:  Your symptoms are not helped by medicine.  You have a headache that is different from what you normally experience.  You have nausea or you vomit.  You have a fever. SEEK IMMEDIATE MEDICAL CARE IF:  Your headache becomes severe.  You have repeated vomiting.  You have a stiff neck.  You have a loss of vision.  You have problems with speech.  You have pain in your eye or ear.  You have muscular weakness or loss of muscle control.  You lose your balance or you have trouble walking.  You feel faint or you pass out.  You have confusion.     This information is not intended to replace advice given to you by your health care provider. Make sure you discuss any questions you have with your health care provider.   Document Released: 03/21/2005 Document Revised: 12/10/2014 Document Reviewed: 07/14/2014 Elsevier Interactive Patient Education 2016 Elsevier Inc.  

## 2015-10-24 NOTE — ED Notes (Signed)
C/o headache (bilateral temporal pain) since Friday.  Denies any other symptoms.  Taking ibuprofen without relief.

## 2017-01-12 IMAGING — CR DG TIBIA/FIBULA 2V*R*
4 series · 4 of 4 positions shown · non-contrast
Comparison: None.

CLINICAL DATA: Fall getting out of pool. Hit leg on concrete edge.
Bruising.

EXAM:
RIGHT TIBIA AND FIBULA - 2 VIEW

[tibia ap (1 of 2)]
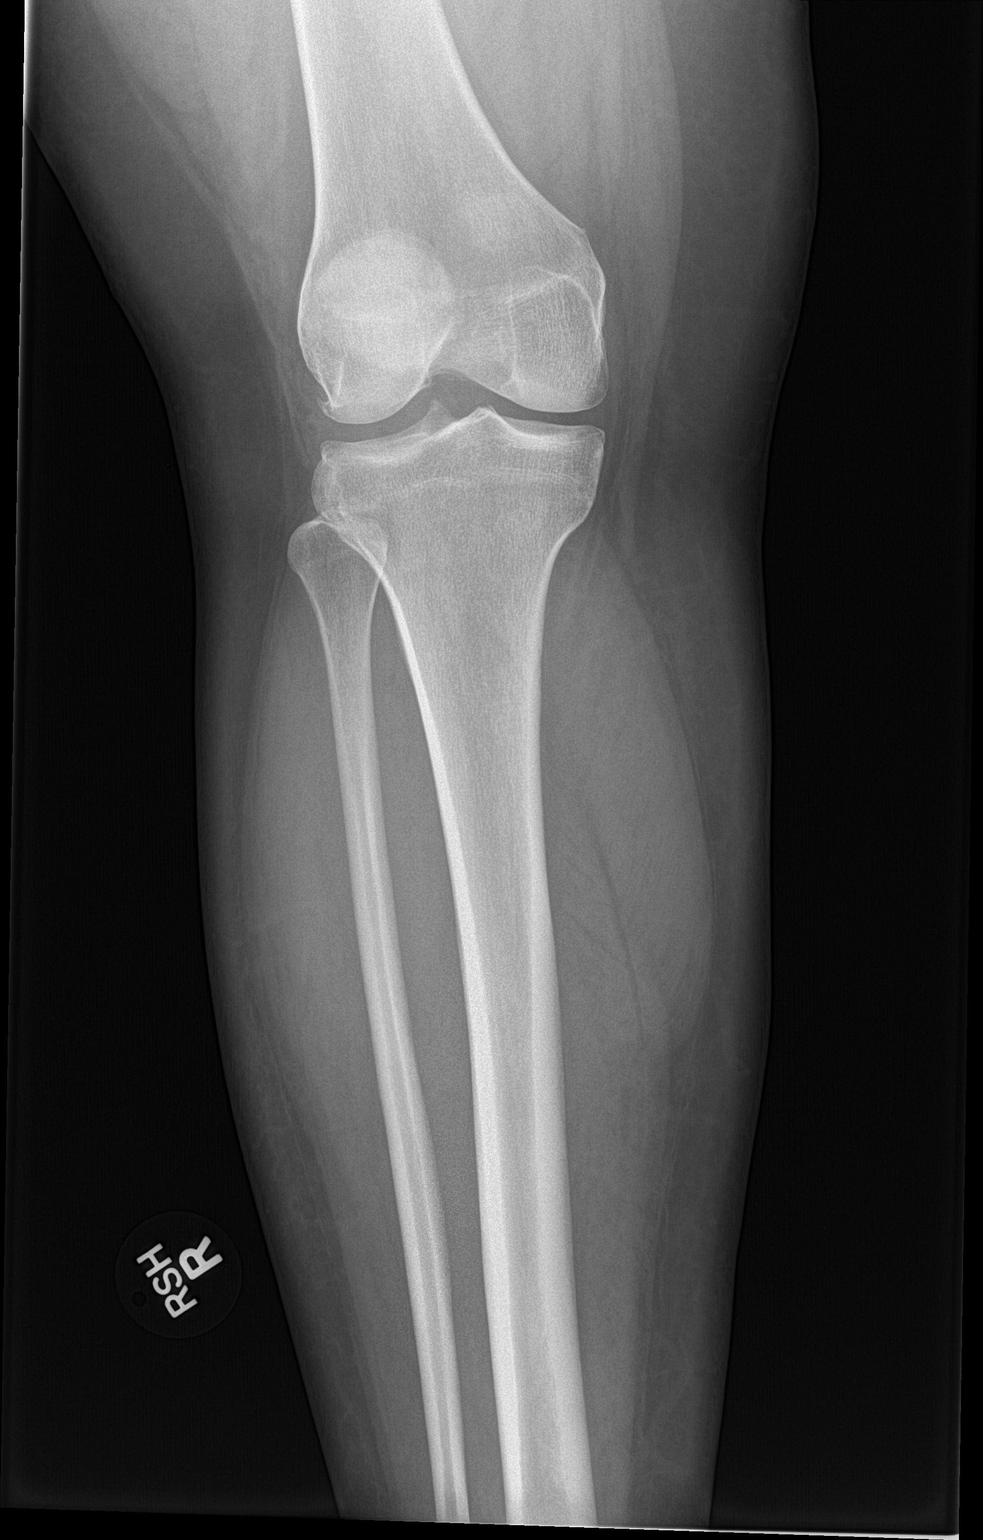

[tibia ap (2 of 2)]
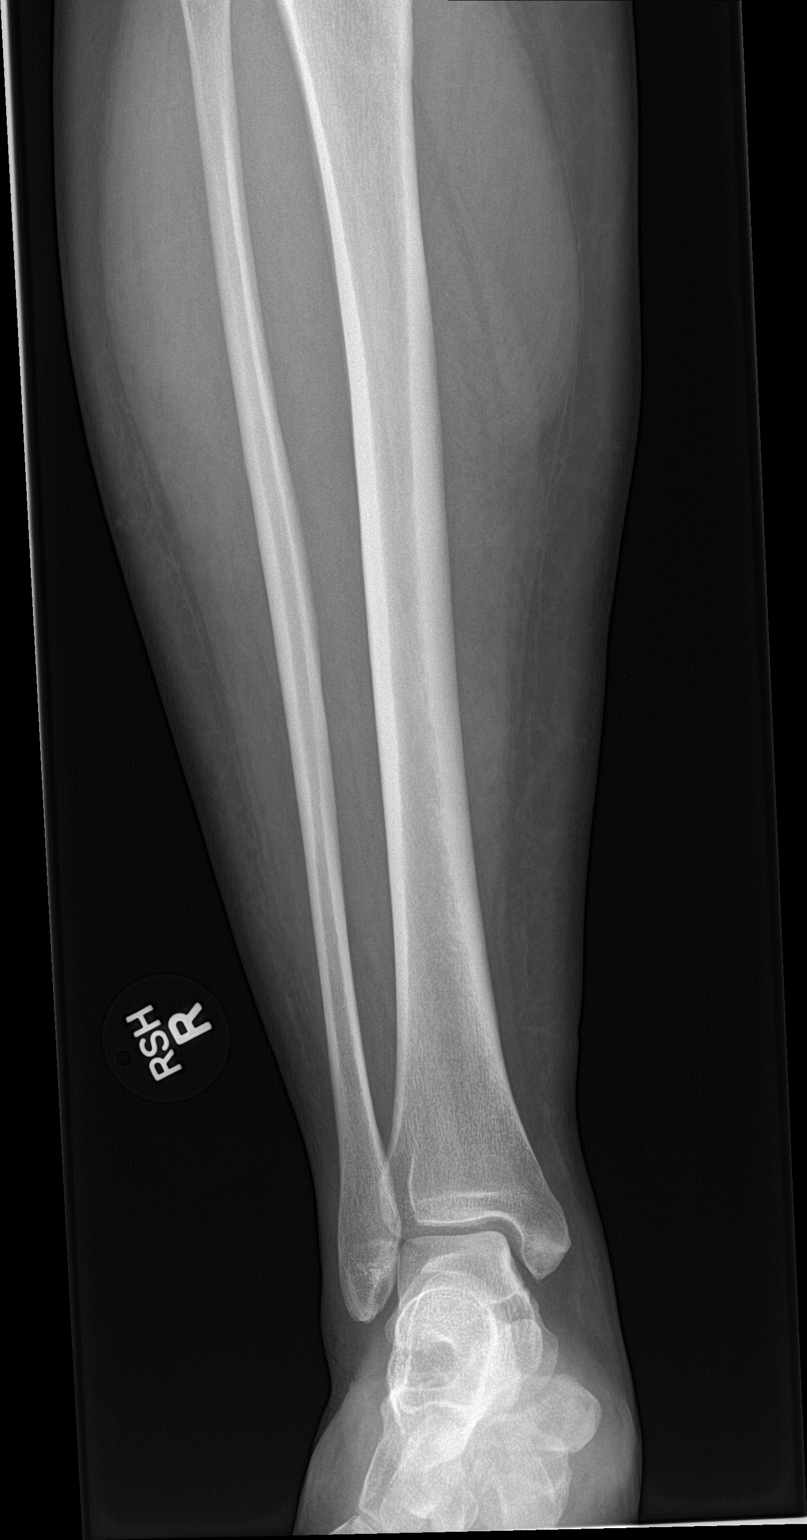

[tibia lat (1 of 2)]
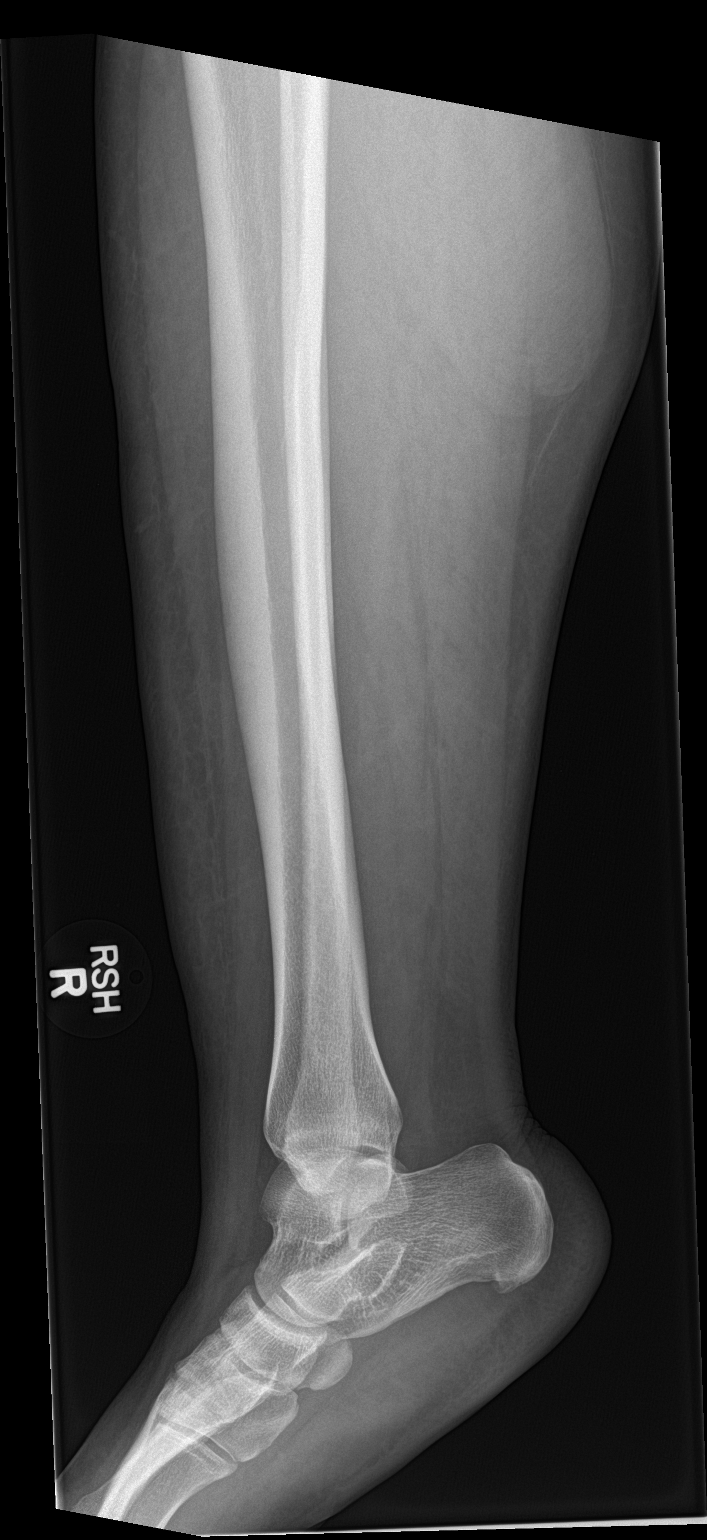

[tibia lat (2 of 2)]
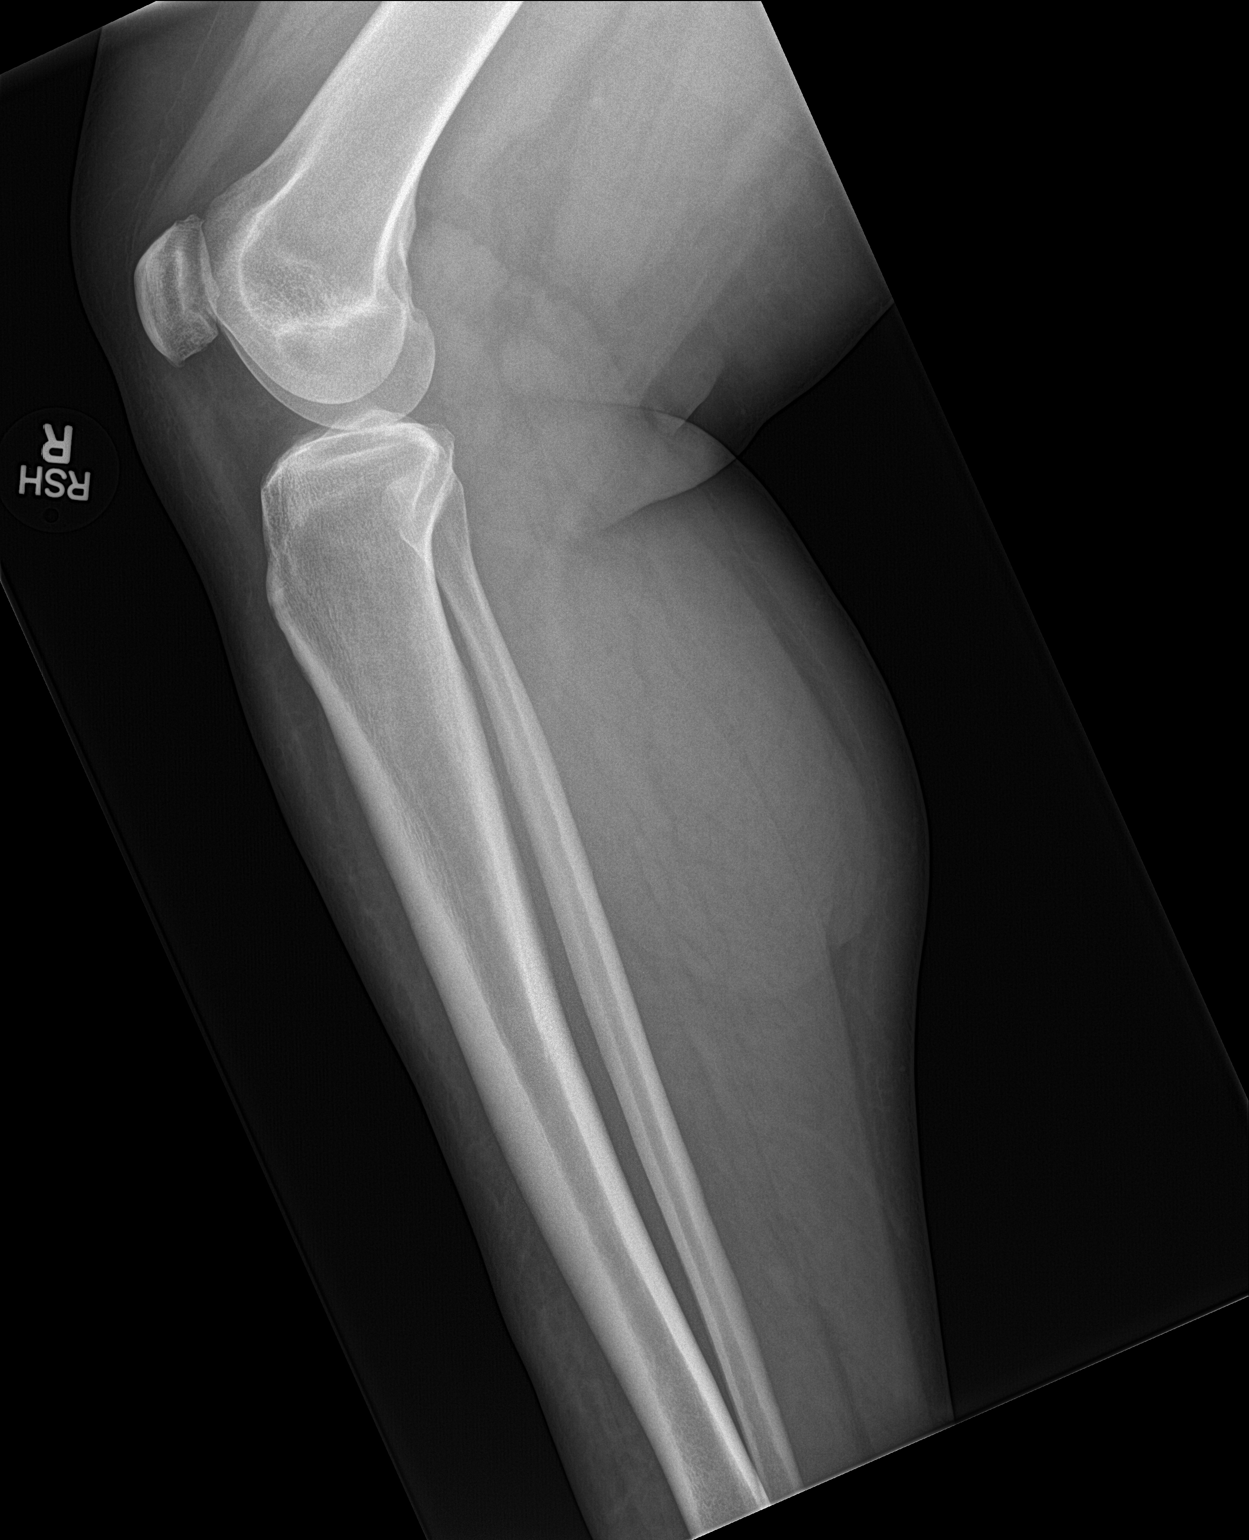

[4 of 4 positions shown; findings below may reference images not displayed]

FINDINGS: Mild anterior soft tissue swelling in the distal right calf. No
underlying bony abnormality. No fracture, subluxation or
dislocation.
IMPRESSION: No acute bony abnormality.

## 2018-10-01 ENCOUNTER — Other Ambulatory Visit: Payer: Self-pay

## 2018-10-01 ENCOUNTER — Ambulatory Visit (HOSPITAL_COMMUNITY)
Admission: EM | Admit: 2018-10-01 | Discharge: 2018-10-01 | Disposition: A | Discharge status: Home / Self Care | Payer: Self-pay | Attending: Emergency Medicine | Admitting: Emergency Medicine

## 2018-10-01 ENCOUNTER — Encounter (HOSPITAL_COMMUNITY): Payer: Self-pay

## 2018-10-01 DIAGNOSIS — I10 Essential (primary) hypertension: Secondary | ICD-10-CM

## 2018-10-01 DIAGNOSIS — Z760 Encounter for issue of repeat prescription: Secondary | ICD-10-CM

## 2018-10-01 HISTORY — DX: Essential (primary) hypertension: I10

## 2018-10-01 MED ORDER — LISINOPRIL 10 MG PO TABS: 10.0000 mg | ORAL_TABLET | Freq: Every day | ORAL | 0 refills | Status: DC

## 2018-10-01 NOTE — ED Provider Notes (Signed)
Kathryn Butler    CSN: [id number] Arrival date & time: 10/01/18  0809      History   Chief Complaint Chief Complaint  Patient presents with  . Medication Refill    Blood Pressure    HPI Kathryn Butler is a 33 y.o. female history of hypertension, tobacco use, presenting today for evaluation of medication refill of blood pressure medicines.  Patient states that she is almost out of her lisinopril.  She has been unable establish care with a primary care.  She has not had any issues with her medicines.  She does note that she frequently will wake up in the morning with a headache, but this will resolve after she takes her blood pressure medicines.  She has been unable to check her blood pressure at home as she does not have a cough.  She denies any other headaches, vision changes, chest pain or shortness of breath.  HPI  Past Medical History:  Diagnosis Date  . Hypertension     Patient Active Problem List   Diagnosis Date Noted  . OBESITY, NOS 06/01/2006  . ECZEMA, ATOPIC DERMATITIS 06/01/2006  . TOBACCO USE, QUIT 06/01/2006    History reviewed. No pertinent surgical history.  OB History   No obstetric history on file.      Home Medications    Prior to Admission medications   Medication Sig Start Date End Date Taking? Authorizing Provider  lisinopril (ZESTRIL) 10 MG tablet Take 1 tablet (10 mg total) by mouth daily. 10/01/18   Butler, Kathryn Hacker, PA-Butler    Family History Family History  Family history unknown: Yes    Social History Social History   Tobacco Use  . Smoking status: Former Smoker  Substance Use Topics  . Alcohol use: No  . Drug use: No     Allergies   Patient has no known allergies.   Review of Systems Review of Systems  Constitutional: Negative for fatigue and fever.  HENT: Negative for congestion, sinus pressure and sore throat.   Eyes: Negative for photophobia, pain and visual disturbance.  Respiratory: Negative for cough and  shortness of breath.   Cardiovascular: Negative for chest pain.  Gastrointestinal: Negative for abdominal pain, nausea and vomiting.  Genitourinary: Negative for decreased urine volume and hematuria.  Musculoskeletal: Negative for myalgias, neck pain and neck stiffness.  Neurological: Positive for headaches. Negative for dizziness, syncope, facial asymmetry, speech difficulty, weakness, light-headedness and numbness.     Physical Exam Triage Vital Signs ED Triage Vitals  Enc Vitals Group     BP 10/01/18 0831 (!) 153/91     Pulse Rate 10/01/18 0831 81     Resp 10/01/18 0831 18     Temp 10/01/18 0831 97.9 F (36.6 Butler)     Temp Source 10/01/18 0831 Oral     SpO2 10/01/18 0831 99 %     Weight --      Height --      Head Circumference --      Peak Flow --      Pain Score 10/01/18 0832 0     Pain Loc --      Pain Edu? --      Excl. in [place]? --    No data found.  Updated Vital Signs BP (!) 153/91 (BP Location: Left Arm)   Pulse 81   Temp 97.9 F (36.6 Butler) (Oral)   Resp 18   LMP 09/26/2018   SpO2 99%   Visual Acuity Right  Eye Distance:   Left Eye Distance:   Bilateral Distance:    Right Eye Near:   Left Eye Near:    Bilateral Near:     Physical Exam Vitals signs and nursing note reviewed.  Constitutional:      General: She is not in acute distress.    Appearance: She is well-developed.     Comments: Sitting comfortably on exam table, well-appearing  HENT:     Head: Normocephalic and atraumatic.  Eyes:     Extraocular Movements: Extraocular movements intact.     Conjunctiva/sclera: Conjunctivae normal.     Pupils: Pupils are equal, round, and reactive to light.  Neck:     Musculoskeletal: Neck supple.  Cardiovascular:     Rate and Rhythm: Normal rate and regular rhythm.     Heart sounds: No murmur.  Pulmonary:     Effort: Pulmonary effort is normal. No respiratory distress.     Breath sounds: Normal breath sounds.     Comments: Breathing comfortably at rest,  CTABL, no wheezing, rales or other adventitious sounds auscultated Abdominal:     Palpations: Abdomen is soft.     Tenderness: There is no abdominal tenderness.  Skin:    General: Skin is warm and dry.  Neurological:     Mental Status: She is alert.      UC Treatments / Results  Labs (all labs ordered are listed, but only abnormal results are displayed) Labs Reviewed - No data to display  EKG None  Radiology No results found.  Procedures Procedures (including critical care time)  Medications Ordered in UC Medications - No data to display  Initial Impression / Assessment and Plan / UC Course  I have reviewed the triage vital signs and the nursing notes.  Pertinent labs & imaging results that were available during my care of the patient were reviewed by me and considered in my medical decision making (see chart for details).    Blood pressure slightly elevated today, currently asymptomatic.  Will refill lisinopril.  Patient expressed concern over appropriate dose.  Advised to establish care with primary care for further dosage management.  Advised with her not monitoring her blood pressure at home, I do not recommend increasing dose at this time.  Discussed warning signs,  discussed working on lifestyle modifications.Discussed strict return precautions. Patient verbalized understanding and is agreeable with plan.  Final Clinical Impressions(s) / UC Diagnoses   Final diagnoses:  Medication refill  Essential hypertension     Discharge Instructions     I have refilled your BP medicine Please establish care with a primary care for further refills and management of your blood pressure   Please go to Emergency Room if you start to experience severe headache, vision changes, decreased urine production, chest pain, shortness of breath, speech slurring, one sided weakness.   ED Prescriptions    Medication Sig Dispense Auth. Provider   lisinopril (ZESTRIL) 10 MG tablet Take  1 tablet (10 mg total) by mouth daily. 60 tablet Butler, Kathryn MarksHallie C, PA-Butler     Controlled Substance Prescriptions [place] Controlled Substance Registry consulted? Not Applicable   Kathryn Butler, Kathryn Butler, [place] 10/01/18 1738

## 2018-10-01 NOTE — ED Triage Notes (Signed)
Pt presents for medication refill on blood pressure medication. 

## 2018-10-01 NOTE — Discharge Instructions (Signed)
I have refilled your BP medicine Please establish care with a primary care for further refills and management of your blood pressure   Please go to Emergency Room if you start to experience severe headache, vision changes, decreased urine production, chest pain, shortness of breath, speech slurring, one sided weakness.

## 2018-11-30 ENCOUNTER — Other Ambulatory Visit: Payer: Self-pay

## 2018-11-30 ENCOUNTER — Ambulatory Visit (INDEPENDENT_AMBULATORY_CARE_PROVIDER_SITE_OTHER): Payer: Self-pay | Admitting: Family Medicine

## 2018-11-30 ENCOUNTER — Encounter: Payer: Self-pay | Admitting: Family Medicine

## 2018-11-30 VITALS — BP 126/92 | HR 76 | Wt 236.8 lb

## 2018-11-30 DIAGNOSIS — Z87891 Personal history of nicotine dependence: Secondary | ICD-10-CM

## 2018-11-30 DIAGNOSIS — E669 Obesity, unspecified: Secondary | ICD-10-CM

## 2018-11-30 DIAGNOSIS — I10 Essential (primary) hypertension: Secondary | ICD-10-CM | POA: Insufficient documentation

## 2018-11-30 MED ORDER — LISINOPRIL 10 MG PO TABS: 10.0000 mg | ORAL_TABLET | Freq: Every day | ORAL | 0 refills | Status: DC

## 2018-11-30 MED FILL — LISINOPRIL 10 MG TABS: 10 | 60 days supply | Qty: 60 | Fill #0

## 2018-11-30 NOTE — Patient Instructions (Signed)
It was great to meet you today! Thank you for letting me participate in your care!  Today, we discussed your high blood pressure. Please purchase a blood pressure cuff and record your readings once per day for 2-4 weeks. Please bring in your findings to your next appointment with me in one month.  At your next appointment we will discuss smoking cessation and small lifestyle changes you can make to improve your blood pressure like changes in diet and exercise level. I look forward to seeing you!  Be well, Harolyn Rutherford, DO PGY-3, [facility]

## 2018-11-30 NOTE — Progress Notes (Signed)
     Subjective: Chief Complaint  Patient presents with  . New Patient (Initial Visit)     HPI: Kathryn Butler is a 32 y.o. presenting to clinic today to discuss the following:  Establish care for HTN Patient has been recently diagnosed with HTN and started on Lisinopril for about 2 weeks. She is also overweight and smokes cigarettes. She has no other PMH, SH, and FH includes HTN and DM. She denies any headaches, blurry vision, changes in vision, chest pain, shortness of breath, nausea, vomiting, or changes in urination. No leg swelling. She presents today to establish care so she can have her blood pressure followed up on a regular basis.  Health Maintenance: influenza vaccine     ROS noted in HPI.   Past Medical, Surgical, Social, and Family History Reviewed & Updated per EMR.   Pertinent Historical Findings include:   Social History   Tobacco Use  Smoking Status Former Smoker   Objective: BP (!) 126/92   Pulse 76   Wt 236 lb 12.8 oz (107.4 kg)   LMP 11/16/2018   SpO2 99%   BMI 44.74 kg/m  Vitals and nursing notes reviewed  Physical Exam Gen: Alert and Oriented x 3, NAD HEENT: Normocephalic, atraumatic CV: RRR, no murmurs, normal S1, S2 split Resp: CTAB, no wheezing, rales, or rhonchi, comfortable work of breathing Ext: no clubbing, cyanosis, or edema Neuro: No gross deficits Skin: warm, dry, intact, no rashes  No results found for this or any previous visit (from the past 72 hour(s)).  Assessment/Plan:  HTN (hypertension) Patient is slightly elevated diastolic pressure today at 126/92. She did take her medication today. - Cont Lisinopril 10mg  daily at night time - Discussed getting a BP cuff at home and taking her blood pressure daily while at rest for the next 2 weeks. - F/u in one month after reviewing home BP readings; can increase lisinopril at that time if still elevated - Discussed lifestyle modifications such as diet and exercise to decrease blood  pressure  OBESITY, NOS Discussed need for weight loss to help avoid health complications/risks - Exercise 30 min per day 5 times per week  - Cut back on high fat foods, carbohydrates, and sugary sources in diet - Will discuss further at next appointment and see if she would be willing to meet with nutrition to develop a plan for weight loss  TOBACCO USE, QUIT Discussed need for smoking cessation to decrease health risks such as cancer, COPD, HTN, etc - Return in one month to discuss options to help her quit smoking such as NRT and/or pharmacologic options (CHantix)    PATIENT EDUCATION PROVIDED: See AVS    Diagnosis and plan along with any newly prescribed medication(s) were discussed in detail with this patient today. The patient verbalized understanding and agreed with the plan. Patient advised if symptoms worsen return to clinic or ER.   Health Maintainance: influenza vaccine   Orders Placed This Encounter  Procedures  . Basic Metabolic Panel    Meds ordered this encounter  Medications  . lisinopril (ZESTRIL) 10 MG tablet    Sig: Take 1 tablet (10 mg total) by mouth daily.    Dispense:  60 tablet    Refill:  0    Harolyn Rutherford, DO 11/30/2018, 9:19 AM PGY-3 [place]

## 2018-11-30 NOTE — Assessment & Plan Note (Signed)
Discussed need for weight loss to help avoid health complications/risks - Exercise 30 min per day 5 times per week  - Cut back on high fat foods, carbohydrates, and sugary sources in diet - Will discuss further at next appointment and see if she would be willing to meet with nutrition to develop a plan for weight loss

## 2018-11-30 NOTE — Assessment & Plan Note (Signed)
Patient is slightly elevated diastolic pressure today at 126/92. She did take her medication today. - Cont Lisinopril 10mg  daily at night time - Discussed getting a BP cuff at home and taking her blood pressure daily while at rest for the next 2 weeks. - F/u in one month after reviewing home BP readings; can increase lisinopril at that time if still elevated - Discussed lifestyle modifications such as diet and exercise to decrease blood pressure

## 2018-11-30 NOTE — Assessment & Plan Note (Addendum)
Discussed need for smoking cessation to decrease health risks such as cancer, COPD, HTN, etc - Return in one month to discuss options to help her quit smoking such as NRT and/or pharmacologic options (CHantix)

## 2018-12-01 LAB — BASIC METABOLIC PANEL
BUN/Creatinine Ratio: 10 (ref 9–23)
BUN: 9 mg/dL (ref 6–20)
CO2: 23 mmol/L (ref 20–29)
Calcium: 8.6 mg/dL — ABNORMAL LOW (ref 8.7–10.2)
Chloride: 102 mmol/L (ref 96–106)
Creatinine, Ser: 0.89 mg/dL (ref 0.57–1.00)
GFR calc Af Amer: 98 mL/min/{1.73_m2} (ref >59)
GFR calc non Af Amer: 85 mL/min/{1.73_m2} (ref >59)
Glucose: 85 mg/dL (ref 65–99)
Potassium: 4.6 mmol/L (ref 3.5–5.2)
Sodium: 138 mmol/L (ref 134–144)

## 2019-02-01 ENCOUNTER — Other Ambulatory Visit: Payer: Self-pay | Admitting: Family Medicine

## 2019-02-04 MED FILL — LISINOPRIL 10 MG TABS: 10 | 60 days supply | Qty: 60 | Fill #0

## 2019-02-18 ENCOUNTER — Ambulatory Visit: Payer: Self-pay | Admitting: Family Medicine

## 2019-04-08 ENCOUNTER — Other Ambulatory Visit: Payer: Self-pay | Admitting: Family Medicine

## 2019-04-08 MED FILL — LISINOPRIL 10 MG TABS: 10 | 90 days supply | Qty: 90 | Fill #0

## 2019-04-22 ENCOUNTER — Ambulatory Visit: Payer: Self-pay | Admitting: Family Medicine

## 2019-06-15 ENCOUNTER — Ambulatory Visit: Payer: Self-pay | Attending: Internal Medicine

## 2019-06-15 DIAGNOSIS — Z23 Encounter for immunization: Secondary | ICD-10-CM

## 2019-06-15 NOTE — Progress Notes (Signed)
   Covid-19 Vaccination Clinic  Name:  Kathryn Butler    MRN: [id number] DOB: 1985-09-05  06/15/2019  [name] was observed post Covid-19 immunization for 15 minutes without incident. She was provided with Vaccine Information Sheet and instruction to access the V-Safe system.   [name] was instructed to call 911 with any severe reactions post vaccine: Marland Kitchen Difficulty breathing  . Swelling of face and throat  . A fast heartbeat  . A bad rash all over body  . Dizziness and weakness   Immunizations Administered    Name Date Dose VIS Date Route   Pfizer COVID-19 Vaccine 06/15/2019  8:25 AM 0.3 mL 03/15/2019 Intramuscular   Manufacturer: [employer], [employer]   Lot: [id number]   NDC: [address]-3

## 2019-07-09 ENCOUNTER — Ambulatory Visit: Payer: Self-pay | Attending: Internal Medicine

## 2019-07-09 DIAGNOSIS — Z23 Encounter for immunization: Secondary | ICD-10-CM

## 2019-07-09 NOTE — Progress Notes (Signed)
   Covid-19 Vaccination Clinic  Name:  CRYSTEL DEMARCO    MRN: [id number] DOB: [birth year]  07/09/2019  Ms. Courser was observed post Covid-19 immunization for 15 minutes without incident. She was provided with Vaccine Information Sheet and instruction to access the V-Safe system.   Ms. Raynor was instructed to call 911 with any severe reactions post vaccine: Marland Kitchen Difficulty breathing  . Swelling of face and throat  . A fast heartbeat  . A bad rash all over body  . Dizziness and weakness   Immunizations Administered    Name Date Dose VIS Date Route   Pfizer COVID-19 Vaccine 07/09/2019  3:45 PM 0.3 mL 03/15/2019 Intramuscular   Manufacturer: [employer], [employer]   Lot: [id number]   NDC: [address]-7

## 2019-07-23 MED FILL — LISINOPRIL 10 MG TABS: 10 | 90 days supply | Qty: 90 | Fill #1

## 2019-09-26 ENCOUNTER — Ambulatory Visit (HOSPITAL_COMMUNITY)
Admission: EM | Admit: 2019-09-26 | Discharge: 2019-09-26 | Disposition: A | Discharge status: Home / Self Care | Payer: Self-pay | Attending: Physician Assistant | Admitting: Physician Assistant

## 2019-09-26 ENCOUNTER — Encounter (HOSPITAL_COMMUNITY): Payer: Self-pay | Admitting: Emergency Medicine

## 2019-09-26 ENCOUNTER — Other Ambulatory Visit: Payer: Self-pay

## 2019-09-26 DIAGNOSIS — J069 Acute upper respiratory infection, unspecified: Secondary | ICD-10-CM

## 2019-09-26 MED ORDER — SALINE SPRAY 0.65 % NA SOLN: 1.0000 | NASAL | 0 refills | Status: AC | PRN

## 2019-09-26 MED ORDER — BENZONATATE 100 MG PO CAPS: 100.0000 mg | ORAL_CAPSULE | Freq: Three times a day (TID) | ORAL | 0 refills | Status: DC

## 2019-09-26 MED ORDER — FLUTICASONE PROPIONATE 50 MCG/ACT NA SUSP: 1.0000 | Freq: Every day | NASAL | 0 refills | Status: AC

## 2019-09-26 MED ORDER — CETIRIZINE HCL 10 MG PO TABS: 10.0000 mg | ORAL_TABLET | Freq: Every day | ORAL | 0 refills | Status: AC

## 2019-09-26 MED FILL — FLUTICASONE PROP 50 MCG SPR: 50 | 60 days supply | Qty: 16 | Fill #0

## 2019-09-26 MED FILL — BENZONATATE 100 MG CAPS: 100 | 7 days supply | Qty: 21 | Fill #0

## 2019-09-26 NOTE — ED Provider Notes (Signed)
[facility]    CSN: [id number] Arrival date & time: 09/26/19  7628      History   Chief Complaint Chief Complaint  Patient presents with  . Sinusitis    HPI Kathryn Butler is a 34 y.o. female.   Patient reports for 2-day history of sinus congestion.  She reports she began with a runny nose and since had worsening congestion over the last 2 days.  She has had occasional sneezing.  Denies fever, chills.  Does endorse some pressure in her sinuses.  Denies headache.  Denies sore throat.  Slight cough every now and then.  Denies abdominal pain, nausea or vomiting.  Denies diarrhea.  She reports she received both Covid vaccines in April.  She does report she has been out of work for a few days and is worried about getting people sick at work.     Past Medical History:  Diagnosis Date  . Hypertension     Patient Active Problem List   Diagnosis Date Noted  . HTN (hypertension) 11/30/2018  . OBESITY, NOS 06/01/2006  . ECZEMA, ATOPIC DERMATITIS 06/01/2006  . TOBACCO USE, QUIT 06/01/2006    History reviewed. No pertinent surgical history.  OB History   No obstetric history on file.      Home Medications    Prior to Admission medications   Medication Sig Start Date End Date Taking? Authorizing Provider  lisinopril (ZESTRIL) 10 MG tablet TAKE 1 TABLET (10 MG TOTAL) BY MOUTH DAILY. 04/08/19  Yes Lockamy, Timothy, DO  benzonatate (TESSALON) 100 MG capsule Take 1 capsule (100 mg total) by mouth every 8 (eight) hours. 09/26/19   Darlen Gledhill, Veryl Speak, PA-C  cetirizine (ZYRTEC ALLERGY) 10 MG tablet Take 1 tablet (10 mg total) by mouth daily. 09/26/19   Venita Seng, Veryl Speak, PA-C  fluticasone (FLONASE) 50 MCG/ACT nasal spray Place 1 spray into both nostrils daily. 09/26/19   Cheryl Chay, Veryl Speak, PA-C  sodium chloride (OCEAN) 0.65 % SOLN nasal spray Place 1 spray into both nostrils as needed for congestion. 09/26/19   Aolani Piggott, Veryl Speak, PA-C    Family History Family History  Problem Relation Age of  Onset  . Diabetes Mother   . Hypertension Mother   . Hypertension Father     Social History Social History   Tobacco Use  . Smoking status: Former Smoker    Types: Cigarettes  . Smokeless tobacco: Never Used  Substance Use Topics  . Alcohol use: No  . Drug use: No     Allergies   Patient has no known allergies.   Review of Systems Review of Systems   Physical Exam Triage Vital Signs ED Triage Vitals  Enc Vitals Group     BP 09/26/19 0831 (!) 134/92     Pulse Rate 09/26/19 0831 97     Resp 09/26/19 0831 16     Temp 09/26/19 0831 98.6 F (37 C)     Temp Source 09/26/19 0831 Oral     SpO2 09/26/19 0831 100 %     Weight --      Height --      Head Circumference --      Peak Flow --      Pain Score 09/26/19 0828 4     Pain Loc --      Pain Edu? --      Excl. in GC? --    No data found.  Updated Vital Signs BP (!) 134/92 (BP Location: Left Arm)  Pulse 97   Temp 98.6 F (37 C) (Oral)   Resp 16   LMP 09/03/2019   SpO2 100%   Visual Acuity Right Eye Distance:   Left Eye Distance:   Bilateral Distance:    Right Eye Near:   Left Eye Near:    Bilateral Near:     Physical Exam Vitals and nursing note reviewed.  Constitutional:      General: She is not in acute distress.    Appearance: She is well-developed. She is not ill-appearing.  HENT:     Head: Normocephalic and atraumatic.     Nose: Congestion and rhinorrhea present.     Mouth/Throat:     Mouth: Mucous membranes are moist.     Comments: Postnasal drip Eyes:     Conjunctiva/sclera: Conjunctivae normal.  Cardiovascular:     Rate and Rhythm: Normal rate and regular rhythm.     Heart sounds: No murmur heard.   Pulmonary:     Effort: Pulmonary effort is normal. No respiratory distress.     Breath sounds: Normal breath sounds.  Abdominal:     Palpations: Abdomen is soft.     Tenderness: There is no abdominal tenderness.  Musculoskeletal:     Cervical back: Neck supple.  Lymphadenopathy:       Cervical: No cervical adenopathy.  Skin:    General: Skin is warm and dry.  Neurological:     Mental Status: She is alert.      UC Treatments / Results  Labs (all labs ordered are listed, but only abnormal results are displayed) Labs Reviewed  SARS CORONAVIRUS 2 (TAT 6-24 HRS)    EKG   Radiology No results found.  Procedures Procedures (including critical care time)  Medications Ordered in UC Medications - No data to display  Initial Impression / Assessment and Plan / UC Course  I have reviewed the triage vital signs and the nursing notes.  Pertinent labs & imaging results that were available during my care of the patient were reviewed by me and considered in my medical decision making (see chart for details).     #Viral URI Patient is a 34 year old reason viral URI.  though she is vaccinated, we discussed that if she is concerned about getting sick that it would be best to go ahead and test her for Covid, as there are some breakthrough cases despite vaccination.  She agrees to this.  Otherwise we will treat symptomatically with Flonase, Zyrtec and Tessalon for cough.  Encouraged hydration.  Return precautions discussed.  Patient verbalized understanding and agreement with plan Final Clinical Impressions(s) / UC Diagnoses   Final diagnoses:  Viral URI     Discharge Instructions     Flonase once daily Zytrec one daily Tessalon as needed for cough  Hydrate well, monitor symptoms, if not improving in 5-7 days, return or follow up with PCP  If your Covid-19 test is positive, you will receive a phone call from [facility] regarding your results. Negative test results are not called. Both positive and negative results area always visible on MyChart. If you do not have a MyChart account, sign up instructions are in your discharge papers.   Persons who are directed to care for themselves at home may discontinue isolation under the following conditions:  . At  least 10 days have passed since symptom onset and . At least 24 hours have passed without running a fever (this means without the use of fever-reducing medications) and . Other symptoms have  improved.  Persons infected with COVID-19 who never develop symptoms may discontinue isolation and other precautions 10 days after the date of their first positive COVID-19 test.      ED Prescriptions    Medication Sig Dispense Auth. Provider   fluticasone (FLONASE) 50 MCG/ACT nasal spray Place 1 spray into both nostrils daily. 11.1 mL Caine Barfield, Marguerita Beards, PA-C   cetirizine (ZYRTEC ALLERGY) 10 MG tablet Take 1 tablet (10 mg total) by mouth daily. 30 tablet Deby Adger, Marguerita Beards, PA-C   sodium chloride (OCEAN) 0.65 % SOLN nasal spray Place 1 spray into both nostrils as needed for congestion. 44 mL Jamesyn Lindell, Marguerita Beards, PA-C   benzonatate (TESSALON) 100 MG capsule Take 1 capsule (100 mg total) by mouth every 8 (eight) hours. 21 capsule Kana Reimann, Marguerita Beards, PA-C     PDMP not reviewed this encounter.   Purnell Shoemaker, PA-C 09/26/19 2324

## 2019-09-26 NOTE — ED Triage Notes (Signed)
Pt c/o sinus congestion onset Tuesday. Pt states she's been out of work since Tuesday and needs a work note. Pt states she has had a headache. Pt denies fever or chills. Pt states she is fully vaccinated. She declines covid testing today.

## 2019-09-26 NOTE — ED Notes (Signed)
Pt refused covid test, she states she does not want anything stuck up her nose and she cannot tolerate it with her nasal congestion.

## 2019-09-26 NOTE — Discharge Instructions (Signed)
Flonase once daily Zytrec one daily Tessalon as needed for cough  Hydrate well, monitor symptoms, if not improving in 5-7 days, return or follow up with PCP  If your Covid-19 test is positive, you will receive a phone call from [facility] regarding your results. Negative test results are not called. Both positive and negative results area always visible on MyChart. If you do not have a MyChart account, sign up instructions are in your discharge papers.   Persons who are directed to care for themselves at home may discontinue isolation under the following conditions:   At least 10 days have passed since symptom onset and  At least 24 hours have passed without running a fever (this means without the use of fever-reducing medications) and  Other symptoms have improved.  Persons infected with COVID-19 who never develop symptoms may discontinue isolation and other precautions 10 days after the date of their first positive COVID-19 test.

## 2020-01-01 MED FILL — LISINOPRIL 10 MG TABS: 10 | 90 days supply | Qty: 90 | Fill #2

## 2020-02-01 ENCOUNTER — Ambulatory Visit: Payer: Self-pay | Attending: Internal Medicine

## 2020-02-01 DIAGNOSIS — Z23 Encounter for immunization: Secondary | ICD-10-CM

## 2020-02-01 NOTE — Progress Notes (Signed)
° °  Covid-19 Vaccination Clinic  Name:  Kathryn Butler    MRN: [id number] DOB: Oct 27, 1985  02/01/2020  [name] was observed post Covid-19 immunization for 15 minutes without incident. She was provided with Vaccine Information Sheet and instruction to access the V-Safe system.   [name] was instructed to call 911 with any severe reactions post vaccine:  Difficulty breathing   Swelling of face and throat   A fast heartbeat   A bad rash all over body   Dizziness and weakness

## 2020-02-05 ENCOUNTER — Other Ambulatory Visit: Payer: Self-pay

## 2020-02-05 ENCOUNTER — Encounter (HOSPITAL_COMMUNITY): Payer: Self-pay

## 2020-02-05 ENCOUNTER — Ambulatory Visit (HOSPITAL_COMMUNITY)
Admission: EM | Admit: 2020-02-05 | Discharge: 2020-02-05 | Disposition: A | Discharge status: Home / Self Care | Payer: Self-pay | Attending: Emergency Medicine | Admitting: Emergency Medicine

## 2020-02-05 DIAGNOSIS — F17200 Nicotine dependence, unspecified, uncomplicated: Secondary | ICD-10-CM | POA: Diagnosis present

## 2020-02-05 DIAGNOSIS — R059 Cough, unspecified: Secondary | ICD-10-CM | POA: Diagnosis present

## 2020-02-05 DIAGNOSIS — J40 Bronchitis, not specified as acute or chronic: Secondary | ICD-10-CM | POA: Diagnosis present

## 2020-02-05 MED ORDER — PREDNISONE 10 MG PO TABS: 20.0000 mg | ORAL_TABLET | Freq: Every day | ORAL | 0 refills | Status: DC

## 2020-02-05 MED ORDER — ALBUTEROL SULFATE HFA 108 (90 BASE) MCG/ACT IN AERS: 2.0000 | INHALATION_SPRAY | RESPIRATORY_TRACT | 0 refills | Status: AC | PRN

## 2020-02-05 NOTE — ED Triage Notes (Signed)
Pt is here with a deep cough that started 02/01/2020 after her booster shot, pt has not taken any meds to relieve discomfort.

## 2020-02-05 NOTE — Discharge Instructions (Addendum)
Drink plenty of fluids, rest, use inhaler. Follow up with PCP. Stop smoking.

## 2020-02-05 NOTE — ED Provider Notes (Signed)
[facility]    CSN: [id number] Arrival date & time: 02/05/20  1741      History   Chief Complaint Chief Complaint  Patient presents with  . Cough    HPI Kathryn Butler is a 34 y.o. female.   The history is provided by the patient. No language interpreter was used.  Cough Cough characteristics:  Non-productive Severity:  Moderate Onset quality:  Sudden Duration:  5 days Timing:  Constant Progression:  Waxing and waning Chronicity:  Recurrent Smoker: yes   Context: weather changes   Relieved by:  Nothing Worsened by:  Nothing Ineffective treatments:  Cough suppressants Associated symptoms: no chills, no ear pain, no fever, no headaches, no myalgias, no rash, no rhinorrhea, no sore throat and no wheezing   Risk factors: no recent infection and no recent travel     Past Medical History:  Diagnosis Date  . Hypertension     Patient Active Problem List   Diagnosis Date Noted  . Bronchitis 02/05/2020  . Cough 02/05/2020  . Smoker 02/05/2020  . HTN (hypertension) 11/30/2018  . OBESITY, NOS 06/01/2006  . ECZEMA, ATOPIC DERMATITIS 06/01/2006  . TOBACCO USE, QUIT 06/01/2006    History reviewed. No pertinent surgical history.  OB History   No obstetric history on file.      Home Medications    Prior to Admission medications   Medication Sig Start Date End Date Taking? Authorizing Provider  albuterol (VENTOLIN HFA) 108 (90 Base) MCG/ACT inhaler Inhale 2 puffs into the lungs every 4 (four) hours as needed for up to 5 days for wheezing or shortness of breath. 02/05/20 02/10/20  Amorie Rentz, Para March, NP  benzonatate (TESSALON) 100 MG capsule Take 1 capsule (100 mg total) by mouth every 8 (eight) hours. 09/26/19   Darr, Gerilyn Pilgrim, PA-C  cetirizine (ZYRTEC ALLERGY) 10 MG tablet Take 1 tablet (10 mg total) by mouth daily. 09/26/19   Darr, Gerilyn Pilgrim, PA-C  fluticasone (FLONASE) 50 MCG/ACT nasal spray Place 1 spray into both nostrils daily. 09/26/19   Darr, Gerilyn Pilgrim, PA-C    lisinopril (ZESTRIL) 10 MG tablet TAKE 1 TABLET (10 MG TOTAL) BY MOUTH DAILY. 04/08/19   Arlyce Harman, DO  predniSONE (DELTASONE) 10 MG tablet Take 2 tablets (20 mg total) by mouth daily with breakfast. X 5 days, Disp # 10, no refills 02/05/20   Joneisha Miles, Para March, NP  sodium chloride (OCEAN) 0.65 % SOLN nasal spray Place 1 spray into both nostrils as needed for congestion. 09/26/19   Darr, Gerilyn Pilgrim, PA-C    Family History Family History  Problem Relation Age of Onset  . Diabetes Mother   . Hypertension Mother   . Hypertension Father     Social History Social History   Tobacco Use  . Smoking status: Current Every Day Smoker    Types: Cigarettes  . Smokeless tobacco: Never Used  Substance Use Topics  . Alcohol use: No  . Drug use: No     Allergies   Patient has no known allergies.   Review of Systems Review of Systems  Constitutional: Negative for chills and fever.  HENT: Positive for congestion. Negative for ear pain, rhinorrhea and sore throat.   Eyes: Negative.   Respiratory: Positive for cough. Negative for wheezing.   Gastrointestinal: Negative for nausea and vomiting.  Endocrine: Negative.   Genitourinary: Negative for dysuria.  Musculoskeletal: Negative for myalgias.  Skin: Negative for rash.  Allergic/Immunologic: Negative.   Neurological: Negative for headaches.  Hematological: Negative.   Psychiatric/Behavioral: Negative.  All other systems reviewed and are negative.    Physical Exam Triage Vital Signs ED Triage Vitals  Enc Vitals Group     BP 02/05/20 1750 (!) 143/70     Pulse Rate 02/05/20 1750 99     Resp 02/05/20 1750 19     Temp 02/05/20 1750 98 F (36.7 C)     Temp Source 02/05/20 1750 Oral     SpO2 02/05/20 1750 100 %     Weight --      Height --      Head Circumference --      Peak Flow --      Pain Score 02/05/20 1748 0     Pain Loc --      Pain Edu? --      Excl. in GC? --    No data found.  Updated Vital Signs BP (!) 143/70  (BP Location: Right Arm)   Pulse 99   Temp 98 F (36.7 C) (Oral)   Resp 19   LMP 01/27/2020   SpO2 100%   Visual Acuity Right Eye Distance:   Left Eye Distance:   Bilateral Distance:    Right Eye Near:   Left Eye Near:    Bilateral Near:     Physical Exam Vitals and nursing note reviewed.  Constitutional:      General: She is not in acute distress.    Appearance: She is well-developed.  HENT:     Head: Normocephalic.  Eyes:     General: Lids are normal.     Conjunctiva/sclera: Conjunctivae normal.     Pupils: Pupils are equal, round, and reactive to light.  Neck:     Trachea: No tracheal deviation.  Cardiovascular:     Rate and Rhythm: Regular rhythm.     Pulses: Normal pulses.     Heart sounds: Normal heart sounds. No murmur heard.   Pulmonary:     Effort: Pulmonary effort is normal.     Breath sounds: Normal breath sounds.  Abdominal:     General: Bowel sounds are normal.     Palpations: Abdomen is soft.     Tenderness: There is no abdominal tenderness.  Musculoskeletal:        General: Normal range of motion.     Cervical back: Normal range of motion.  Lymphadenopathy:     Cervical: No cervical adenopathy.  Skin:    General: Skin is warm and dry.     Findings: No rash.  Neurological:     Mental Status: She is alert and oriented to person, place, and time.     GCS: GCS eye subscore is 4. GCS verbal subscore is 5. GCS motor subscore is 6.  Psychiatric:        Speech: Speech normal.        Behavior: Behavior normal. Behavior is cooperative.      UC Treatments / Results  Labs (all labs ordered are listed, but only abnormal results are displayed) Labs Reviewed - No data to display  EKG   Radiology No results found.  Procedures Procedures (including critical care time)  Medications Ordered in UC Medications - No data to display  Initial Impression / Assessment and Plan / UC Course  I have reviewed the triage vital signs and the nursing  notes.  Pertinent labs & imaging results that were available during my care of the patient were reviewed by me and considered in my medical decision making (see chart for details).  Final Clinical Impressions(s) / UC Diagnoses   Final diagnoses:  Bronchitis  Cough  Smoker     Discharge Instructions     Drink plenty of fluids, rest, use inhaler. Follow up with PCP. Stop smoking.    ED Prescriptions    Medication Sig Dispense Auth. Provider   albuterol (VENTOLIN HFA) 108 (90 Base) MCG/ACT inhaler Inhale 2 puffs into the lungs every 4 (four) hours as needed for up to 5 days for wheezing or shortness of breath. 18 g Naudia Crosley, NP   predniSONE (DELTASONE) 10 MG tablet Take 2 tablets (20 mg total) by mouth daily with breakfast. X 5 days, Disp # 10, no refills 10 tablet Elana Jian, NP     I have reviewed the PDMP during this encounter.   Clancy Gourd, NP 02/05/20 2032

## 2020-06-16 ENCOUNTER — Other Ambulatory Visit (HOSPITAL_COMMUNITY): Payer: Self-pay | Admitting: Family Medicine

## 2020-06-16 ENCOUNTER — Other Ambulatory Visit: Payer: Self-pay | Admitting: Family Medicine

## 2020-07-22 ENCOUNTER — Other Ambulatory Visit: Payer: Self-pay

## 2020-07-22 ENCOUNTER — Encounter (HOSPITAL_COMMUNITY): Payer: Self-pay

## 2020-07-22 ENCOUNTER — Ambulatory Visit (HOSPITAL_COMMUNITY)
Admission: EM | Admit: 2020-07-22 | Discharge: 2020-07-22 | Disposition: A | Discharge status: Home / Self Care | Payer: Self-pay | Attending: Family Medicine | Admitting: Family Medicine

## 2020-07-22 DIAGNOSIS — F17210 Nicotine dependence, cigarettes, uncomplicated: Secondary | ICD-10-CM | POA: Insufficient documentation

## 2020-07-22 DIAGNOSIS — R059 Cough, unspecified: Secondary | ICD-10-CM | POA: Insufficient documentation

## 2020-07-22 DIAGNOSIS — J069 Acute upper respiratory infection, unspecified: Secondary | ICD-10-CM

## 2020-07-22 DIAGNOSIS — Z20822 Contact with and (suspected) exposure to covid-19: Secondary | ICD-10-CM | POA: Insufficient documentation

## 2020-07-22 DIAGNOSIS — R509 Fever, unspecified: Secondary | ICD-10-CM | POA: Insufficient documentation

## 2020-07-22 LAB — POC INFLUENZA A AND B ANTIGEN (URGENT CARE ONLY)
Influenza A Ag: NEGATIVE
Influenza B Ag: NEGATIVE

## 2020-07-22 MED ORDER — PROMETHAZINE-DM 6.25-15 MG/5ML PO SYRP: 5.0000 mL | ORAL_SOLUTION | Freq: Four times a day (QID) | ORAL | 0 refills | Status: DC | PRN

## 2020-07-22 NOTE — ED Triage Notes (Signed)
Pt c/o cough, fever and nasal congestion X 3 days.  She states she took Tylenol to reduce fever. She states she has not been vomiting. Pt c/o headaches.

## 2020-07-22 NOTE — ED Provider Notes (Signed)
[facility]    CSN: [id number] Arrival date & time: 07/22/20  1554      History   Chief Complaint Chief Complaint  Patient presents with  . Cough  . Fever  . Nasal Congestion    HPI Kathryn Butler is a 35 y.o. female.   Here today with 3-day history of cough, fever, congestion, malaise.  Denies chest pain, shortness of breath, abdominal pain, nausea vomiting diarrhea.  So far taking Coricidin and Tylenol with mild temporary relief.  Denies any known sick contacts.  No known chronic medical problems      Past Medical History:  Diagnosis Date  . Hypertension     Patient Active Problem List   Diagnosis Date Noted  . Bronchitis 02/05/2020  . Cough 02/05/2020  . Smoker 02/05/2020  . HTN (hypertension) 11/30/2018  . OBESITY, NOS 06/01/2006  . ECZEMA, ATOPIC DERMATITIS 06/01/2006  . TOBACCO USE, QUIT 06/01/2006    History reviewed. No pertinent surgical history.  OB History   No obstetric history on file.      Home Medications    Prior to Admission medications   Medication Sig Start Date End Date Taking? Authorizing Provider  promethazine-dextromethorphan (PROMETHAZINE-DM) 6.25-15 MG/5ML syrup Take 5 mLs by mouth 4 (four) times daily as needed for cough. 07/22/20  Yes Particia Nearing, PA-C  albuterol (VENTOLIN HFA) 108 (90 Base) MCG/ACT inhaler Inhale 2 puffs into the lungs every 4 (four) hours as needed for up to 5 days for wheezing or shortness of breath. 02/05/20 02/10/20  Defelice, Para March, NP  benzonatate (TESSALON) 100 MG capsule Take 1 capsule (100 mg total) by mouth every 8 (eight) hours. 09/26/19   Darr, Gerilyn Pilgrim, PA-C  cetirizine (ZYRTEC ALLERGY) 10 MG tablet Take 1 tablet (10 mg total) by mouth daily. 09/26/19   Darr, Gerilyn Pilgrim, PA-C  fluticasone (FLONASE) 50 MCG/ACT nasal spray Place 1 spray into both nostrils daily. 09/26/19   Darr, Gerilyn Pilgrim, PA-C  lisinopril (ZESTRIL) 10 MG tablet TAKE 1 TABLET (10 MG TOTAL) BY MOUTH DAILY. 06/16/20   Dana Allan,  MD  lisinopril (ZESTRIL) 10 MG tablet TAKE 1 TABLET (10 MG TOTAL) BY MOUTH DAILY. 06/16/20 06/16/21  Dana Allan, MD  predniSONE (DELTASONE) 10 MG tablet Take 2 tablets (20 mg total) by mouth daily with breakfast. X 5 days, Disp # 10, no refills 02/05/20   Defelice, Para March, NP  sodium chloride (OCEAN) 0.65 % SOLN nasal spray Place 1 spray into both nostrils as needed for congestion. 09/26/19   Darr, Gerilyn Pilgrim, PA-C    Family History Family History  Problem Relation Age of Onset  . Diabetes Mother   . Hypertension Mother   . Hypertension Father     Social History Social History   Tobacco Use  . Smoking status: Current Every Day Smoker    Types: Cigarettes  . Smokeless tobacco: Never Used  Substance Use Topics  . Alcohol use: No  . Drug use: No     Allergies   Patient has no known allergies.   Review of Systems Review of Systems Per HPI  Physical Exam Triage Vital Signs ED Triage Vitals  Enc Vitals Group     BP 07/22/20 1607 (!) 146/92     Pulse Rate 07/22/20 1607 100     Resp 07/22/20 1607 16     Temp 07/22/20 1607 98.1 F (36.7 C)     Temp Source 07/22/20 1607 Oral     SpO2 07/22/20 1607 96 %  Weight --      Height --      Head Circumference --      Peak Flow --      Pain Score 07/22/20 1606 4     Pain Loc --      Pain Edu? --      Excl. in GC? --    No data found.  Updated Vital Signs BP (!) 146/92 (BP Location: Right Arm)   Pulse 100   Temp 98.1 F (36.7 C) (Oral)   Resp 16   LMP 07/22/2020 (Exact Date)   SpO2 96%   Visual Acuity Right Eye Distance:   Left Eye Distance:   Bilateral Distance:    Right Eye Near:   Left Eye Near:    Bilateral Near:     Physical Exam Vitals and nursing note reviewed.  Constitutional:      Appearance: Normal appearance. She is not ill-appearing.  HENT:     Head: Atraumatic.     Nose: Rhinorrhea present.     Mouth/Throat:     Mouth: Mucous membranes are moist.     Pharynx: Posterior oropharyngeal erythema  present. No oropharyngeal exudate.  Eyes:     Extraocular Movements: Extraocular movements intact.     Conjunctiva/sclera: Conjunctivae normal.  Cardiovascular:     Rate and Rhythm: Normal rate and regular rhythm.     Heart sounds: Normal heart sounds.  Pulmonary:     Effort: Pulmonary effort is normal.     Breath sounds: Normal breath sounds. No wheezing or rales.  Abdominal:     General: Bowel sounds are normal. There is no distension.     Palpations: Abdomen is soft.     Tenderness: There is no abdominal tenderness. There is no guarding.  Musculoskeletal:        General: Normal range of motion.     Cervical back: Normal range of motion and neck supple.  Skin:    General: Skin is warm and dry.  Neurological:     Mental Status: She is alert and oriented to person, place, and time.  Psychiatric:        Mood and Affect: Mood normal.        Thought Content: Thought content normal.        Judgment: Judgment normal.      UC Treatments / Results  Labs (all labs ordered are listed, but only abnormal results are displayed) Labs Reviewed  SARS CORONAVIRUS 2 (TAT 6-24 HRS)  POC INFLUENZA A AND B ANTIGEN (URGENT CARE ONLY)    EKG   Radiology No results found.  Procedures Procedures (including critical care time)  Medications Ordered in UC Medications - No data to display  Initial Impression / Assessment and Plan / UC Course  I have reviewed the triage vital signs and the nursing notes.  Pertinent labs & imaging results that were available during my care of the patient were reviewed by me and considered in my medical decision making (see chart for details).     Overall well-appearing today.  Rapid flu negative, COVID PCR pending.  Will treat with Phenergan DM, Mucinex, Coricidin, supportive home care.  Isolation reviewed, work note given.  Return for acutely worsening symptoms.  Final Clinical Impressions(s) / UC Diagnoses   Final diagnoses:  Viral URI with cough    Discharge Instructions   None    ED Prescriptions    Medication Sig Dispense Auth. Provider   promethazine-dextromethorphan (PROMETHAZINE-DM) 6.25-15 MG/5ML syrup Take 5 mLs  by mouth 4 (four) times daily as needed for cough. 100 mL Particia Nearing, [place]     PDMP not reviewed this encounter.   Particia Nearing, [place] 07/22/20 1809

## 2020-07-23 LAB — SARS CORONAVIRUS 2 (TAT 6-24 HRS): SARS Coronavirus 2: NEGATIVE

## 2020-12-04 ENCOUNTER — Other Ambulatory Visit (HOSPITAL_COMMUNITY): Payer: Self-pay

## 2020-12-04 MED FILL — Lisinopril Tab 10 MG: ORAL | 30 days supply | Qty: 30 | Fill #0 | Status: AC

## 2021-01-20 ENCOUNTER — Other Ambulatory Visit: Payer: Self-pay | Admitting: Family Medicine

## 2021-01-20 ENCOUNTER — Other Ambulatory Visit (HOSPITAL_COMMUNITY): Payer: Self-pay

## 2021-01-21 ENCOUNTER — Other Ambulatory Visit (HOSPITAL_COMMUNITY): Payer: Self-pay

## 2021-01-22 ENCOUNTER — Other Ambulatory Visit (HOSPITAL_COMMUNITY): Payer: Self-pay

## 2021-01-22 MED ORDER — LISINOPRIL 10 MG PO TABS
10.0000 mg | ORAL_TABLET | Freq: Every day | ORAL | 0 refills | Status: AC
  Filled 2021-01-22: qty 30, 30d supply, fill #0

## 2021-02-01 ENCOUNTER — Other Ambulatory Visit (HOSPITAL_COMMUNITY): Payer: Self-pay

## 2021-03-23 ENCOUNTER — Ambulatory Visit (HOSPITAL_COMMUNITY)
Admission: EM | Admit: 2021-03-23 | Discharge: 2021-03-23 | Disposition: A | Discharge status: Home / Self Care | Payer: Self-pay | Attending: Urgent Care | Admitting: Urgent Care

## 2021-03-23 ENCOUNTER — Encounter (HOSPITAL_COMMUNITY): Payer: Self-pay

## 2021-03-23 ENCOUNTER — Other Ambulatory Visit: Payer: Self-pay

## 2021-03-23 DIAGNOSIS — R197 Diarrhea, unspecified: Secondary | ICD-10-CM

## 2021-03-23 DIAGNOSIS — R110 Nausea: Secondary | ICD-10-CM

## 2021-03-23 DIAGNOSIS — K529 Noninfective gastroenteritis and colitis, unspecified: Secondary | ICD-10-CM

## 2021-03-23 DIAGNOSIS — R5383 Other fatigue: Secondary | ICD-10-CM

## 2021-03-23 MED ORDER — ONDANSETRON 8 MG PO TBDP: 8.0000 mg | ORAL_TABLET | Freq: Three times a day (TID) | ORAL | 0 refills | Status: AC | PRN

## 2021-03-23 MED ORDER — LOPERAMIDE HCL 2 MG PO CAPS: 2.0000 mg | ORAL_CAPSULE | Freq: Two times a day (BID) | ORAL | 0 refills | Status: AC | PRN

## 2021-03-23 NOTE — ED Provider Notes (Addendum)
[facility]   MRN: [id number] DOB: Dec 06, 1985  Subjective:   Kathryn Butler is a 35 y.o. female presenting for 1 day history of acute onset persistent diarrhea, fecal urgency, nausea without vomiting, fatigue, headache since last night.  Patient states she was not able to sleep well because she felt like she needed to keep having diarrhea.  No body aches, cough, chest pain, shortness of breath.  No bloody stools.  No history of GI issues.  No long distance travel recently.  No recent antibiotic use.  Patient has kept her normal dietary routine.  No current facility-administered medications for this encounter.  Current Outpatient Medications:    albuterol (VENTOLIN HFA) 108 (90 Base) MCG/ACT inhaler, Inhale 2 puffs into the lungs every 4 (four) hours as needed for up to 5 days for wheezing or shortness of breath., Disp: 18 g, Rfl: 0   cetirizine (ZYRTEC ALLERGY) 10 MG tablet, Take 1 tablet (10 mg total) by mouth daily., Disp: 30 tablet, Rfl: 0   fluticasone (FLONASE) 50 MCG/ACT nasal spray, Place 1 spray into both nostrils daily., Disp: 11.1 mL, Rfl: 0   lisinopril (ZESTRIL) 10 MG tablet, TAKE 1 TABLET (10 MG TOTAL) BY MOUTH DAILY., Disp: 30 tablet, Rfl: 0   lisinopril (ZESTRIL) 10 MG tablet, Take 1 tablet (10 mg total) by mouth daily., Disp: 30 tablet, Rfl: 0   sodium chloride (OCEAN) 0.65 % SOLN nasal spray, Place 1 spray into both nostrils as needed for congestion., Disp: 44 mL, Rfl: 0   No Known Allergies  Past Medical History:  Diagnosis Date   Hypertension      History reviewed. No pertinent surgical history.  Family History  Problem Relation Age of Onset   Diabetes Mother    Hypertension Mother    Hypertension Father     Social History   Tobacco Use   Smoking status: Every Day    Types: Cigarettes   Smokeless tobacco: Never  Substance Use Topics   Alcohol use: No   Drug use: No    ROS   Objective:   Vitals: BP (!) 153/93 (BP Location:  Right Arm)    Pulse (!) 102    Temp 99.1 F (37.3 C) (Oral)    Resp 18    LMP 03/16/2021    SpO2 97%   Physical Exam Constitutional:      General: She is not in acute distress.    Appearance: Normal appearance. She is well-developed. She is not ill-appearing, toxic-appearing or diaphoretic.  HENT:     Head: Normocephalic and atraumatic.     Nose: Nose normal.     Mouth/Throat:     Mouth: Mucous membranes are moist.  Eyes:     General: No scleral icterus.       Right eye: No discharge.        Left eye: No discharge.     Extraocular Movements: Extraocular movements intact.     Conjunctiva/sclera: Conjunctivae normal.     Pupils: Pupils are equal, round, and reactive to light.  Cardiovascular:     Rate and Rhythm: Normal rate and regular rhythm.     Pulses: Normal pulses.     Heart sounds: Normal heart sounds. No murmur heard.   No friction rub. No gallop.  Pulmonary:     Effort: Pulmonary effort is normal. No respiratory distress.     Breath sounds: Normal breath sounds. No stridor. No wheezing, rhonchi or rales.  Abdominal:  General: There is no distension.     Palpations: Abdomen is soft. There is no mass.     Tenderness: There is abdominal tenderness. There is no right CVA tenderness, left CVA tenderness, guarding or rebound.     Comments: Hyperactive bowel sounds.   Skin:    General: Skin is warm and dry.     Findings: No rash.  Neurological:     General: No focal deficit present.     Mental Status: She is alert and oriented to person, place, and time.  Psychiatric:        Mood and Affect: Mood normal.        Behavior: Behavior normal.        Thought Content: Thought content normal.        Judgment: Judgment normal.    Assessment and Plan :   PDMP not reviewed this encounter.  1. Colitis   2. Diarrhea, unspecified type   3. Nausea   4. Other fatigue    Patient refused respiratory testing.  Will manage for suspected viral colitis with supportive care.   Recommended patient hydrate well, eat light meals and maintain electrolytes.  Will use Zofran and Imodium for nausea, vomiting and diarrhea. Counseled patient on potential for adverse effects with medications prescribed/recommended today, ER and return-to-clinic precautions discussed, patient verbalized understanding.     Wallis Bamberg, PA-C 03/23/21 1444

## 2021-03-23 NOTE — ED Triage Notes (Signed)
Pt c/o diarrhea, fatigue, and headache since last night. States had the same sx's 2 wks ago.

## 2021-03-23 NOTE — Discharge Instructions (Signed)

## 2022-11-15 ENCOUNTER — Encounter (HOSPITAL_COMMUNITY): Payer: Self-pay

## 2022-11-15 ENCOUNTER — Ambulatory Visit (HOSPITAL_COMMUNITY)
Admission: EM | Admit: 2022-11-15 | Discharge: 2022-11-15 | Disposition: A | Discharge status: Home / Self Care | Payer: Self-pay | Attending: Internal Medicine | Admitting: Internal Medicine

## 2022-11-15 DIAGNOSIS — G43009 Migraine without aura, not intractable, without status migrainosus: Secondary | ICD-10-CM

## 2022-11-15 MED ORDER — DEXAMETHASONE SODIUM PHOSPHATE 10 MG/ML IJ SOLN
INTRAMUSCULAR | Status: AC
  Filled 2022-11-15: qty 1

## 2022-11-15 MED ORDER — KETOROLAC TROMETHAMINE 30 MG/ML IJ SOLN
INTRAMUSCULAR | Status: AC
  Filled 2022-11-15: qty 1

## 2022-11-15 MED ORDER — DEXAMETHASONE SODIUM PHOSPHATE 10 MG/ML IJ SOLN
10.0000 mg | Freq: Once | INTRAMUSCULAR | Status: AC
  Administered 2022-11-15: 10 mg via INTRAMUSCULAR

## 2022-11-15 MED ORDER — METOCLOPRAMIDE HCL 5 MG/ML IJ SOLN
5.0000 mg | Freq: Once | INTRAMUSCULAR | Status: AC
  Administered 2022-11-15: 5 mg via INTRAMUSCULAR

## 2022-11-15 MED ORDER — KETOROLAC TROMETHAMINE 30 MG/ML IJ SOLN
30.0000 mg | Freq: Once | INTRAMUSCULAR | Status: AC
  Administered 2022-11-15: 30 mg via INTRAMUSCULAR

## 2022-11-15 MED ORDER — METOCLOPRAMIDE HCL 5 MG/ML IJ SOLN
INTRAMUSCULAR | Status: AC
  Filled 2022-11-15: qty 2

## 2022-11-15 NOTE — ED Triage Notes (Signed)
Pt presents with c/o HA X 3 days. States she has been taking medicine at home, has not had any relief. Denies fever. States she is concerned she is having migraines.

## 2022-11-15 NOTE — Discharge Instructions (Signed)

## 2022-11-15 NOTE — ED Provider Notes (Signed)
[facility]    CSN: [id number] Arrival date & time: 11/15/22  1229      History   Chief Complaint Chief Complaint  Patient presents with   Headache    HPI JOELLYN ZOLTOWSKI is a 37 y.o. female.   Patient presents to urgent care for evaluation of headache to the left frontal aspect of the head that started 3 days ago. History of migraines, has not had a migraine "in a long time" (over a year). Reports associated generalized fatigue, photophobia, and phonophobia, no other visual changes.  No viral URI symptoms's, dizziness, paresthesias, extremity weakness, body aches, fever/chills, neck pain, or tinnitus.  No recent syncope or head injuries.  She has never been evaluated by neurologist for migraine headaches and does not get headaches frequently.  Former smoker, denies drug use. Taking ibuprofen without relief.  Denies chance of pregnancy.  Last dose of ibuprofen was 2 days ago.   Headache   Past Medical History:  Diagnosis Date   Hypertension     Patient Active Problem List   Diagnosis Date Noted   Bronchitis 02/05/2020   Cough 02/05/2020   Smoker 02/05/2020   HTN (hypertension) 11/30/2018   OBESITY, NOS 06/01/2006   ECZEMA, ATOPIC DERMATITIS 06/01/2006   TOBACCO USE, QUIT 06/01/2006    History reviewed. No pertinent surgical history.  OB History   No obstetric history on file.      Home Medications    Prior to Admission medications   Medication Sig Start Date End Date Taking? Authorizing Provider  albuterol (VENTOLIN HFA) 108 (90 Base) MCG/ACT inhaler Inhale 2 puffs into the lungs every 4 (four) hours as needed for up to 5 days for wheezing or shortness of breath. 02/05/20 02/10/20  Defelice, Para March, NP  cetirizine (ZYRTEC ALLERGY) 10 MG tablet Take 1 tablet (10 mg total) by mouth daily. 09/26/19   Darr, Gerilyn Pilgrim, PA-C  fluticasone (FLONASE) 50 MCG/ACT nasal spray Place 1 spray into both nostrils daily. 09/26/19   Darr, Gerilyn Pilgrim, PA-C  lisinopril (ZESTRIL) 10  MG tablet TAKE 1 TABLET (10 MG TOTAL) BY MOUTH DAILY. 06/16/20   Dana Allan, MD  lisinopril (ZESTRIL) 10 MG tablet Take 1 tablet (10 mg total) by mouth daily. 01/22/21   Dana Allan, MD  loperamide (IMODIUM) 2 MG capsule Take 1 capsule (2 mg total) by mouth 2 (two) times daily as needed for diarrhea or loose stools. 03/23/21   Wallis Bamberg, PA-C  ondansetron (ZOFRAN-ODT) 8 MG disintegrating tablet Take 1 tablet (8 mg total) by mouth every 8 (eight) hours as needed for nausea or vomiting. 03/23/21   Wallis Bamberg, PA-C  sodium chloride (OCEAN) 0.65 % SOLN nasal spray Place 1 spray into both nostrils as needed for congestion. 09/26/19   Darr, Gerilyn Pilgrim, PA-C    Family History Family History  Problem Relation Age of Onset   Diabetes Mother    Hypertension Mother    Hypertension Father     Social History Social History   Tobacco Use   Smoking status: Every Day    Types: Cigarettes   Smokeless tobacco: Never  Substance Use Topics   Alcohol use: No   Drug use: No     Allergies   Patient has no known allergies.   Review of Systems Review of Systems  Neurological:  Positive for headaches.  Per HPI   Physical Exam Triage Vital Signs ED Triage Vitals  Encounter Vitals Group     BP 11/15/22 1329 (!) 143/94  Systolic BP Percentile --      Diastolic BP Percentile --      Pulse Rate 11/15/22 1329 77     Resp 11/15/22 1329 18     Temp 11/15/22 1329 98 F (36.7 C)     Temp Source 11/15/22 1329 Oral     SpO2 11/15/22 1329 98 %     Weight --      Height --      Head Circumference --      Peak Flow --      Pain Score 11/15/22 1328 6     Pain Loc --      Pain Education --      Exclude from Growth Chart --    No data found.  Updated Vital Signs BP (!) 143/94 (BP Location: Right Arm)   Pulse 77   Temp 98 F (36.7 C) (Oral)   Resp 18   LMP 10/17/2022 (Approximate)   SpO2 98%   Visual Acuity Right Eye Distance:   Left Eye Distance:   Bilateral Distance:    Right Eye  Near:   Left Eye Near:    Bilateral Near:     Physical Exam Vitals and nursing note reviewed.  Constitutional:      Appearance: She is obese. She is ill-appearing. She is not toxic-appearing.  HENT:     Head: Normocephalic and atraumatic.     Right Ear: Hearing and external ear normal.     Left Ear: Hearing and external ear normal.     Nose: Nose normal.     Mouth/Throat:     Lips: Pink.  Eyes:     General: Lids are normal. Vision grossly intact. Gaze aligned appropriately.     Extraocular Movements: Extraocular movements intact.     Conjunctiva/sclera: Conjunctivae normal.  Cardiovascular:     Rate and Rhythm: Normal rate and regular rhythm.     Heart sounds: Normal heart sounds, S1 normal and S2 normal.  Pulmonary:     Effort: Pulmonary effort is normal. No respiratory distress.     Breath sounds: Normal breath sounds and air entry.  Musculoskeletal:     Cervical back: Neck supple.  Skin:    General: Skin is warm and dry.     Capillary Refill: Capillary refill takes less than 2 seconds.     Findings: No rash.  Neurological:     General: No focal deficit present.     Mental Status: She is alert and oriented to person, place, and time. Mental status is at baseline.     Cranial Nerves: Cranial nerves 2-12 are intact. No dysarthria or facial asymmetry.     Sensory: Sensation is intact.     Motor: Motor function is intact.     Coordination: Coordination is intact.     Gait: Gait is intact.     Comments: Strength and sensation intact to bilateral upper and lower extremities (5/5). Moves all 4 extremities with normal coordination voluntarily. Non-focal neuro exam.   Psychiatric:        Mood and Affect: Mood normal.        Speech: Speech normal.        Behavior: Behavior normal.        Thought Content: Thought content normal.        Judgment: Judgment normal.      UC Treatments / Results  Labs (all labs ordered are listed, but only abnormal results are displayed) Labs  Reviewed - No data to display  EKG   Radiology No results found.  Procedures Procedures (including critical care time)  Medications Ordered in UC Medications  ketorolac (TORADOL) 30 MG/ML injection 30 mg (30 mg Intramuscular Given 11/15/22 1427)  metoCLOPramide (REGLAN) injection 5 mg (5 mg Intramuscular Given 11/15/22 1427)  dexamethasone (DECADRON) injection 10 mg (10 mg Intramuscular Given 11/15/22 1427)    Initial Impression / Assessment and Plan / UC Course  I have reviewed the triage vital signs and the nursing notes.  Pertinent labs & imaging results that were available during my care of the patient were reviewed by me and considered in my medical decision making (see chart for details).  Migraine without aura and without status migrainosus not intractable Evaluation suggests migraine type headache.  Low suspicion for migraine type headache/acute intracranial abnormality.  Neurologic exam without focal deficit, patient intact to baseline.  Nursing administered headache cocktail in clinic for headache with relief prior to discharge. May use OTC medications as needed for further head pain relief.  Encouraged to drink plenty of fluids to stay well hydrated. Follow-up with PCP as needed for further evaluation of persistent headaches. Neurology referral may be beneficial in future should headaches worsen or become more persistent.  Counseled patient on potential for adverse effects with medications prescribed/recommended today, strict ER and return-to-clinic precautions discussed, patient verbalized understanding.    Final Clinical Impressions(s) / UC Diagnoses   Final diagnoses:  Migraine without aura and without status migrainosus, not intractable     Discharge Instructions      You have been evaluated today for headache.  You were given medicines for your headache in the clinic today which included a strong NSAID, so do not  take ibuprofen or other NSAIDS (Aleve,  aspirin, naproxen, ibuprofen, goody powder, etc.) for the next 12 hours.  Starting tomorrow, take 600mg  ibuprofen every 6 hours or tylenol 1,000 every 6 hours as needed for pain.  Avoid areas of loud noise/harsh light and remember to drink plenty of water to stay well hydrated.  Please follow up with your primary care provider for further management of your headaches.  Please seek emergency medical care if you experience worsening or uncontrolled pain, vision changes, recurrent vomiting, difficulty with normal activities, abnormal behavior, difficulty walking, numbness, weakness, or any other concerning symptoms.       ED Prescriptions   None    PDMP not reviewed this encounter.   Carlisle Beers, [place] 11/15/22 1502

## 2022-11-24 ENCOUNTER — Other Ambulatory Visit: Payer: Self-pay

## 2022-11-24 ENCOUNTER — Ambulatory Visit
Admission: EM | Admit: 2022-11-24 | Discharge: 2022-11-24 | Disposition: A | Discharge status: Home / Self Care | Payer: Self-pay | Attending: Physician Assistant | Admitting: Physician Assistant

## 2022-11-24 ENCOUNTER — Encounter: Payer: Self-pay | Admitting: Emergency Medicine

## 2022-11-24 ENCOUNTER — Emergency Department (HOSPITAL_COMMUNITY)
Admission: EM | Admit: 2022-11-24 | Discharge: 2022-11-24 | Disposition: A | Discharge status: Home / Self Care | Payer: BLUE CROSS/BLUE SHIELD | Attending: Emergency Medicine | Admitting: Emergency Medicine

## 2022-11-24 ENCOUNTER — Encounter (HOSPITAL_COMMUNITY): Payer: Self-pay

## 2022-11-24 ENCOUNTER — Encounter: Payer: Self-pay | Admitting: Physician Assistant

## 2022-11-24 DIAGNOSIS — T782XXA Anaphylactic shock, unspecified, initial encounter: Secondary | ICD-10-CM

## 2022-11-24 DIAGNOSIS — R0689 Other abnormalities of breathing: Secondary | ICD-10-CM | POA: Diagnosis present

## 2022-11-24 LAB — COMPREHENSIVE METABOLIC PANEL
ALT: 15 U/L (ref 0–44)
AST: 19 U/L (ref 15–41)
Albumin: 3.6 g/dL (ref 3.5–5.0)
Alkaline Phosphatase: 79 U/L (ref 38–126)
Anion gap: 11 (ref 5–15)
BUN: 13 mg/dL (ref 6–20)
CO2: 22 mmol/L (ref 22–32)
Calcium: 8.8 mg/dL — ABNORMAL LOW (ref 8.9–10.3)
Chloride: 100 mmol/L (ref 98–111)
Creatinine, Ser: 0.95 mg/dL (ref 0.44–1.00)
GFR, Estimated: >60 mL/min (ref >60)
Glucose, Bld: 92 mg/dL (ref 70–99)
Potassium: 3.7 mmol/L (ref 3.5–5.1)
Sodium: 133 mmol/L — ABNORMAL LOW (ref 135–145)
Total Bilirubin: 0.5 mg/dL (ref 0.3–1.2)
Total Protein: 7.7 g/dL (ref 6.5–8.1)

## 2022-11-24 LAB — CBC WITH DIFFERENTIAL/PLATELET
Abs Immature Granulocytes: 0.05 10*3/uL (ref 0.00–0.07)
Basophils Absolute: 0 10*3/uL (ref 0.0–0.1)
Basophils Relative: 0 %
Eosinophils Absolute: 0.1 10*3/uL (ref 0.0–0.5)
Eosinophils Relative: 1 %
HCT: 38.9 % (ref 36.0–46.0)
Hemoglobin: 11.8 g/dL — ABNORMAL LOW (ref 12.0–15.0)
Immature Granulocytes: 1 %
Lymphocytes Relative: 20 %
Lymphs Abs: 1.8 10*3/uL (ref 0.7–4.0)
MCH: 24.2 pg — ABNORMAL LOW (ref 26.0–34.0)
MCHC: 30.3 g/dL (ref 30.0–36.0)
MCV: 79.9 fL — ABNORMAL LOW (ref 80.0–100.0)
Monocytes Absolute: 0.8 10*3/uL (ref 0.1–1.0)
Monocytes Relative: 9 %
Neutro Abs: 6.5 10*3/uL (ref 1.7–7.7)
Neutrophils Relative %: 69 %
Platelets: 476 10*3/uL — ABNORMAL HIGH (ref 150–400)
RBC: 4.87 MIL/uL (ref 3.87–5.11)
RDW: 17.3 % — ABNORMAL HIGH (ref 11.5–15.5)
WBC: 9.2 10*3/uL (ref 4.0–10.5)
nRBC: 0 % (ref 0.0–0.2)

## 2022-11-24 LAB — HCG, QUANTITATIVE, PREGNANCY: hCG, Beta Chain, Quant, S: <1 m[IU]/mL (ref <5)

## 2022-11-24 MED ORDER — EPINEPHRINE 0.3 MG/0.3ML IJ SOAJ: 0.3000 mg | INTRAMUSCULAR | 0 refills | Status: AC | PRN

## 2022-11-24 MED ORDER — PREDNISONE 20 MG PO TABS: ORAL_TABLET | ORAL | 0 refills | Status: AC

## 2022-11-24 MED ORDER — METHYLPREDNISOLONE SODIUM SUCC 125 MG IJ SOLR
125.0000 mg | Freq: Once | INTRAMUSCULAR | Status: AC
  Administered 2022-11-24: 125 mg via INTRAVENOUS
  Filled 2022-11-24: qty 2

## 2022-11-24 MED ORDER — FAMOTIDINE IN NACL 20-0.9 MG/50ML-% IV SOLN
20.0000 mg | Freq: Once | INTRAVENOUS | Status: AC
  Administered 2022-11-24: 20 mg via INTRAVENOUS
  Filled 2022-11-24: qty 50

## 2022-11-24 MED ORDER — DIPHENHYDRAMINE HCL 50 MG/ML IJ SOLN
50.0000 mg | Freq: Once | INTRAMUSCULAR | Status: AC
  Administered 2022-11-24: 50 mg via INTRAVENOUS
  Filled 2022-11-24: qty 1

## 2022-11-24 MED ORDER — EPINEPHRINE 1 MG/10ML IJ SOSY
0.3000 mg | PREFILLED_SYRINGE | Freq: Once | INTRAMUSCULAR | Status: AC
  Administered 2022-11-24: 0.3 mg via INTRAMUSCULAR

## 2022-11-24 NOTE — ED Provider Notes (Signed)
[facility]    CSN: [id number] Arrival date & time: 11/24/22  1358      History   Chief Complaint Chief Complaint  Patient presents with   Allergic Reaction    HPI Kathryn Butler is a 37 y.o. female.   Patient here today for evaluation of possible allergic reaction.  She states that she ate spaghetti that she prepared at work as she always does.  She has never had reaction like this before and has no known allergies.  She notes that within minutes of eating spaghetti she started to break out in hives diffusely.  She feels nauseated and heart feels like it is racing.  She denies any shortness of breath or trouble swallowing.  She did take 1 dose of cetirizine without resolution.  The history is provided by the patient.  Allergic Reaction Presenting symptoms: rash     Past Medical History:  Diagnosis Date   Hypertension     Patient Active Problem List   Diagnosis Date Noted   Bronchitis 02/05/2020   Cough 02/05/2020   Smoker 02/05/2020   HTN (hypertension) 11/30/2018   OBESITY, NOS 06/01/2006   ECZEMA, ATOPIC DERMATITIS 06/01/2006   TOBACCO USE, QUIT 06/01/2006    History reviewed. No pertinent surgical history.  OB History   No obstetric history on file.      Home Medications    Prior to Admission medications   Medication Sig Start Date End Date Taking? Authorizing Provider  albuterol (VENTOLIN HFA) 108 (90 Base) MCG/ACT inhaler Inhale 2 puffs into the lungs every 4 (four) hours as needed for up to 5 days for wheezing or shortness of breath. Patient not taking: Reported on 11/24/2022 02/05/20 02/10/20  Defelice, Para March, NP  cetirizine (ZYRTEC ALLERGY) 10 MG tablet Take 1 tablet (10 mg total) by mouth daily. Patient not taking: Reported on 11/24/2022 09/26/19   Darr, Gerilyn Pilgrim, PA-C  fluticasone [facility]) 50 MCG/ACT nasal spray Place 1 spray into both nostrils daily. Patient not taking: Reported on 11/24/2022 09/26/19   Darr, Gerilyn Pilgrim, PA-C  lisinopril  (ZESTRIL) 10 MG tablet TAKE 1 TABLET (10 MG TOTAL) BY MOUTH DAILY. Patient not taking: Reported on 11/24/2022 06/16/20   Dana Allan, MD  lisinopril (ZESTRIL) 10 MG tablet Take 1 tablet (10 mg total) by mouth daily. Patient not taking: Reported on 11/24/2022 01/22/21   Dana Allan, MD  loperamide (IMODIUM) 2 MG capsule Take 1 capsule (2 mg total) by mouth 2 (two) times daily as needed for diarrhea or loose stools. Patient not taking: Reported on 11/24/2022 03/23/21   Wallis Bamberg, PA-C  ondansetron (ZOFRAN-ODT) 8 MG disintegrating tablet Take 1 tablet (8 mg total) by mouth every 8 (eight) hours as needed for nausea or vomiting. Patient not taking: Reported on 11/24/2022 03/23/21   Wallis Bamberg, PA-C  sodium chloride (OCEAN) 0.65 % SOLN nasal spray Place 1 spray into both nostrils as needed for congestion. Patient not taking: Reported on 11/24/2022 09/26/19   Darr, Gerilyn Pilgrim, PA-C    Family History Family History  Problem Relation Age of Onset   Diabetes Mother    Hypertension Mother    Hypertension Father     Social History Social History   Tobacco Use   Smoking status: Former    Types: Cigarettes   Smokeless tobacco: Never  Vaping Use   Vaping status: Never Used  Substance Use Topics   Alcohol use: No   Drug use: Yes    Types: Marijuana     Allergies  Patient has no known allergies.   Review of Systems Review of Systems  Constitutional:  Negative for chills and fever.  Eyes:  Negative for discharge and redness.  Respiratory:  Negative for shortness of breath.   Cardiovascular:  Positive for palpitations.  Gastrointestinal:  Positive for nausea. Negative for vomiting.  Skin:  Positive for rash.     Physical Exam Triage Vital Signs ED Triage Vitals [11/24/22 1404]  Encounter Vitals Group     BP 113/61     Systolic BP Percentile      Diastolic BP Percentile      Pulse Rate (!) 128     Resp (!) 24     Temp 97.9 F (36.6 C)     Temp Source Oral     SpO2 98 %      Weight      Height      Head Circumference      Peak Flow      Pain Score      Pain Loc      Pain Education      Exclude from Growth Chart    No data found.  Updated Vital Signs BP 130/70 (BP Location: Right Arm) Comment (BP Location): large cuff  Pulse (!) 112   Temp 97.9 F (36.6 C) (Oral)   Resp (!) 22   LMP 11/13/2022 (Approximate)   SpO2 98%       Physical Exam Vitals and nursing note reviewed.  Constitutional:      General: She is not in acute distress.    Appearance: Normal appearance. She is not ill-appearing.  HENT:     Head: Normocephalic and atraumatic.     Nose: Nose normal. No congestion or rhinorrhea.     Mouth/Throat:     Mouth: Mucous membranes are moist.     Pharynx: Oropharynx is clear. No oropharyngeal exudate or posterior oropharyngeal erythema.  Eyes:     Conjunctiva/sclera: Conjunctivae normal.  Cardiovascular:     Rate and Rhythm: Tachycardia present.  Pulmonary:     Effort: Pulmonary effort is normal. No respiratory distress.     Breath sounds: No wheezing, rhonchi or rales.  Skin:    Comments: Diffuse mildly erythematous wheals noted to bilateral arms, neck, over body no involvement of face or facial swelling noted  Neurological:     Mental Status: She is alert.  Psychiatric:        Mood and Affect: Mood normal.        Behavior: Behavior normal.        Thought Content: Thought content normal.      UC Treatments / Results  Labs (all labs ordered are listed, but only abnormal results are displayed) Labs Reviewed - No data to display  EKG   Radiology No results found.  Procedures Procedures (including critical care time)  Medications Ordered in UC Medications  EPINEPHrine (ADRENALIN) 1 MG/10ML injection 0.3 mg (0.3 mg Intramuscular Given 11/24/22 1415)    Initial Impression / Assessment and Plan / UC Course  I have reviewed the triage vital signs and the nursing notes.  Pertinent labs & imaging results that were available  during my care of the patient were reviewed by me and considered in my medical decision making (see chart for details).    Epinephrine administered given criteria met for anaphylaxis with nausea, hives and tachycardia as well as hypotension.  EMS notified for transport.  Patient remained stable while in urgent care setting.  Final Clinical Impressions(s) /  UC Diagnoses   Final diagnoses:  Anaphylaxis, initial encounter   Discharge Instructions   None    ED Prescriptions   None    PDMP not reviewed this encounter.   Tomi Bamberger, PA-C 11/24/22 [phone]

## 2022-11-24 NOTE — ED Notes (Signed)
Mother is with patient and has been from the beginning of visit

## 2022-11-24 NOTE — ED Notes (Signed)
Report to EMS.

## 2022-11-24 NOTE — ED Triage Notes (Signed)
Patient ate spaghetti and started having itching and hives.  No breathing difficulties but went to UC ans received epipen.  Patient has had improvement but hives are still noticeable.

## 2022-11-24 NOTE — ED Notes (Signed)
Patient is being discharged from the Urgent Care and sent to the Emergency Department via EMS (called EMS @ [phone]). Per Provider Guy Sandifer), patient is in need of higher level of care due to Increased HR and Anaphylaxis). Patient is aware and verbalizes understanding of plan of care.  Vitals:   11/24/22 1404  BP: 113/61  Pulse: (!) 128  Resp: (!) 24  Temp: 97.9 F (36.6 C)  SpO2: 98%

## 2022-11-24 NOTE — ED Provider Notes (Signed)
[place] EMERGENCY DEPARTMENT AT [facility] Provider Note   CSN: [id number] Arrival date & time: 11/24/22  1531     History  Chief Complaint  Patient presents with   Allergic Reaction    Kathryn Butler is a 37 y.o. female here presenting with possible allergic reaction.  Patient states that she was at work and suddenly broke out in hives and had trouble breathing.  Patient went to urgent care and was given IM epi.  Patient states that her symptoms have improved.  Patient states that she ate some spaghetti but did not add anything different.  Patient had no known food allergies.  The history is provided by the patient.       Home Medications Prior to Admission medications   Medication Sig Start Date End Date Taking? Authorizing Provider  albuterol (VENTOLIN HFA) 108 (90 Base) MCG/ACT inhaler Inhale 2 puffs into the lungs every 4 (four) hours as needed for up to 5 days for wheezing or shortness of breath. Patient not taking: Reported on 11/24/2022 02/05/20 02/10/20  Defelice, Para March, NP  cetirizine (ZYRTEC ALLERGY) 10 MG tablet Take 1 tablet (10 mg total) by mouth daily. Patient not taking: Reported on 11/24/2022 09/26/19   Darr, Gerilyn Pilgrim, PA-C  fluticasone [facility]) 50 MCG/ACT nasal spray Place 1 spray into both nostrils daily. Patient not taking: Reported on 11/24/2022 09/26/19   Darr, Gerilyn Pilgrim, PA-C  lisinopril (ZESTRIL) 10 MG tablet TAKE 1 TABLET (10 MG TOTAL) BY MOUTH DAILY. Patient not taking: Reported on 11/24/2022 06/16/20   Dana Allan, MD  lisinopril (ZESTRIL) 10 MG tablet Take 1 tablet (10 mg total) by mouth daily. Patient not taking: Reported on 11/24/2022 01/22/21   Dana Allan, MD  loperamide (IMODIUM) 2 MG capsule Take 1 capsule (2 mg total) by mouth 2 (two) times daily as needed for diarrhea or loose stools. Patient not taking: Reported on 11/24/2022 03/23/21   Wallis Bamberg, PA-C  ondansetron (ZOFRAN-ODT) 8 MG disintegrating tablet Take 1 tablet (8 mg total) by mouth  every 8 (eight) hours as needed for nausea or vomiting. Patient not taking: Reported on 11/24/2022 03/23/21   Wallis Bamberg, PA-C  sodium chloride (OCEAN) 0.65 % SOLN nasal spray Place 1 spray into both nostrils as needed for congestion. Patient not taking: Reported on 11/24/2022 09/26/19   Darr, Gerilyn Pilgrim, PA-C      Allergies    Patient has no known allergies.    Review of Systems   Review of Systems  Skin:  Positive for rash.  All other systems reviewed and are negative.   Physical Exam Updated Vital Signs BP (!) 143/96   Pulse 91   Temp 98.2 F (36.8 C) (Oral)   Resp 17   Ht 5\' 1"  (1.549 m)   Wt 107 kg   LMP 11/13/2022 (Approximate)   SpO2 100%   BMI 44.59 kg/m  Physical Exam Vitals and nursing note reviewed.  Constitutional:      Appearance: Normal appearance.  HENT:     Head: Normocephalic.     Nose: Nose normal.     Mouth/Throat:     Mouth: Mucous membranes are moist.     Comments: Posterior pharynx is clear Eyes:     Extraocular Movements: Extraocular movements intact.     Pupils: Pupils are equal, round, and reactive to light.  Cardiovascular:     Rate and Rhythm: Normal rate and regular rhythm.     Pulses: Normal pulses.     Heart sounds: Normal heart  sounds.  Pulmonary:     Effort: Pulmonary effort is normal.     Breath sounds: Normal breath sounds.  Abdominal:     General: Abdomen is flat.     Palpations: Abdomen is soft.  Musculoskeletal:        General: Normal range of motion.     Cervical back: Normal range of motion.  Skin:    Capillary Refill: Capillary refill takes less than 2 seconds.     Comments: Mild urticaria on the extremities  Neurological:     General: No focal deficit present.     Mental Status: She is alert.  Psychiatric:        Mood and Affect: Mood normal.        Behavior: Behavior normal.     ED Results / Procedures / Treatments   Labs (all labs ordered are listed, but only abnormal results are displayed) Labs Reviewed  CBC  WITH DIFFERENTIAL/PLATELET - Abnormal; Notable for the following components:      Result Value   Hemoglobin 11.8 (*)    MCV 79.9 (*)    MCH 24.2 (*)    RDW 17.3 (*)    Platelets 476 (*)    All other components within normal limits  COMPREHENSIVE METABOLIC PANEL  HCG, QUANTITATIVE, PREGNANCY    EKG None  Radiology No results found.  Procedures Procedures    Medications Ordered in ED Medications  methylPREDNISolone sodium succinate (SOLU-MEDROL) 125 mg/2 mL injection 125 mg (125 mg Intravenous Given 11/24/22 1811)  diphenhydrAMINE (BENADRYL) injection 50 mg (50 mg Intravenous Given 11/24/22 1812)  famotidine (PEPCID) IVPB 20 mg premix (20 mg Intravenous New Bag/Given 11/24/22 1812)    ED Course/ Medical Decision Making/ A&P                                 Medical Decision Making Kathryn Butler is a 37 y.o. female here presenting with possible anaphylaxis.  Patient is feeling much better after IM epi.  Will give Solu-Medrol and Pepcid and Benadryl.  Will observe for 4 hours and reassess  7:21 PM Patient observed in the ED for about 4 hours.  Patient states that she is feeling better.  Will discharge home with course of steroids and also will give her EpiPen  Problems Addressed: Anaphylaxis, initial encounter: acute illness or injury  Amount and/or Complexity of Data Reviewed Labs: ordered. Decision-making details documented in ED Course.  Risk Prescription drug management.    Final Clinical Impression(s) / ED Diagnoses Final diagnoses:  None    Rx / DC Orders ED Discharge Orders     None         Charlynne Pander, MD 11/24/22 [phone]

## 2022-11-24 NOTE — ED Triage Notes (Signed)
Patient reports itching, rash started 15 minutes ago.  Patient had just finished eating spaghetti.  Patient denies sob, denies throat swelling or tightness.  Patient reports face is swollen.  Patient is anxious  Has taken allergy pill-cetirizine

## 2022-11-24 NOTE — Discharge Instructions (Signed)
You likely had an anaphylactic reaction to unknown substance.  I recommend you take prednisone as prescribed  Please keep EpiPen on you in case you have trouble breathing or throat closing again then please give yourself a shot and come immediately to the ER  Please follow-up with your doctor  Return to ER if you have trouble breathing, worse rash, throat closing

## 2022-11-24 NOTE — ED Notes (Signed)
Called EMS to check on status of transport. "We are waiting on a truck to become available". Provider updated.

## 2022-11-25 ENCOUNTER — Other Ambulatory Visit (HOSPITAL_COMMUNITY): Payer: Self-pay

## 2022-11-29 ENCOUNTER — Other Ambulatory Visit (HOSPITAL_COMMUNITY): Payer: Self-pay

## 2022-11-30 ENCOUNTER — Other Ambulatory Visit: Payer: Self-pay

## 2022-11-30 ENCOUNTER — Other Ambulatory Visit (HOSPITAL_COMMUNITY): Payer: Self-pay

## 2022-11-30 MED ORDER — EPINEPHRINE 0.3 MG/0.3ML IJ SOAJ
INTRAMUSCULAR | 0 refills | Status: AC
  Filled 2022-11-30: qty 2, 2d supply, fill #0

## 2022-12-12 ENCOUNTER — Other Ambulatory Visit (HOSPITAL_COMMUNITY): Payer: Self-pay

## 2023-02-04 ENCOUNTER — Ambulatory Visit
Admission: EM | Admit: 2023-02-04 | Discharge: 2023-02-04 | Discharge status: Against Medical Advice | Payer: BLUE CROSS/BLUE SHIELD

## 2023-02-04 NOTE — ED Notes (Signed)
Called x 3 no answer 

## 2023-02-04 NOTE — ED Notes (Signed)
Called for room, no answer in lobby or outside

## 2023-02-06 ENCOUNTER — Ambulatory Visit
Admission: EM | Admit: 2023-02-06 | Discharge: 2023-02-06 | Disposition: A | Discharge status: Home / Self Care | Payer: BLUE CROSS/BLUE SHIELD

## 2023-02-06 DIAGNOSIS — L0291 Cutaneous abscess, unspecified: Secondary | ICD-10-CM | POA: Diagnosis not present

## 2023-02-06 MED ORDER — DOXYCYCLINE HYCLATE 100 MG PO CAPS: 100.0000 mg | ORAL_CAPSULE | Freq: Two times a day (BID) | ORAL | 0 refills | Status: AC

## 2023-02-06 NOTE — ED Triage Notes (Signed)
"  I have a boil/bump on the left upper side of my face above my cheek bone, very tender, swollen, sore". No injury. No Fever. "This is causing my head/face to hurt".

## 2023-02-07 ENCOUNTER — Encounter (HOSPITAL_COMMUNITY): Payer: Self-pay

## 2023-02-07 ENCOUNTER — Ambulatory Visit (HOSPITAL_COMMUNITY)
Admission: EM | Admit: 2023-02-07 | Discharge: 2023-02-07 | Disposition: A | Discharge status: Home / Self Care | Payer: BLUE CROSS/BLUE SHIELD | Attending: Physician Assistant | Admitting: Physician Assistant

## 2023-02-07 DIAGNOSIS — R220 Localized swelling, mass and lump, head: Secondary | ICD-10-CM

## 2023-02-07 MED ORDER — CEFTRIAXONE SODIUM 1 G IJ SOLR
1.0000 g | Freq: Once | INTRAMUSCULAR | Status: AC
  Administered 2023-02-07: 1 g via INTRAMUSCULAR

## 2023-02-07 MED ORDER — CEFTRIAXONE SODIUM 1 G IJ SOLR
INTRAMUSCULAR | Status: AC
  Filled 2023-02-07: qty 10

## 2023-02-07 MED ORDER — LIDOCAINE HCL (PF) 1 % IJ SOLN
INTRAMUSCULAR | Status: AC
  Filled 2023-02-07: qty 2

## 2023-02-07 MED ORDER — CEFTRIAXONE SODIUM 500 MG IJ SOLR
INTRAMUSCULAR | Status: AC
  Filled 2023-02-07: qty 500

## 2023-02-07 NOTE — Discharge Instructions (Signed)
Warm compresses to area of swelling 20 minutes 4 times a day.  Take antibiotic as directed.  Try taking zyrtec otc to help with eye itching

## 2023-02-07 NOTE — ED Triage Notes (Addendum)
Pt presents with left-sided facial swelling and pain. Pt also reporting headache. Pt seen yesterday at [facility], given an antibiotic to help symptoms. Pt is concerned of "being allergic to the antibiotic they gave to me being the swelling and pain has worsened." Pt reports only one dose of the antibiotics taken yesterday.

## 2023-02-07 NOTE — ED Provider Notes (Signed)
[facility]    CSN: [id number] Arrival date & time: 02/07/23  1020      History   Chief Complaint Chief Complaint  Patient presents with   Facial Swelling    HPI Kathryn Butler is a 37 y.o. female.   Patient is here for recheck of swelling to her face.  Patient reports that she was seen yesterday and started on antibiotics.  Patient reports that she has not had any relief.  Patient took a dose last night and a dose this a.m.  Patient reports swelling has increased.  Reports she has had itchy irritated eyes for over a week.  Patient reports she developed a area of swelling to her left cheek.  The history is provided by the patient. No language interpreter was used.    Past Medical History:  Diagnosis Date   Hypertension     Patient Active Problem List   Diagnosis Date Noted   Bronchitis 02/05/2020   Cough 02/05/2020   Smoker 02/05/2020   HTN (hypertension) 11/30/2018   OBESITY, NOS 06/01/2006   ECZEMA, ATOPIC DERMATITIS 06/01/2006   TOBACCO USE, QUIT 06/01/2006    History reviewed. No pertinent surgical history.  OB History   No obstetric history on file.      Home Medications    Prior to Admission medications   Medication Sig Start Date End Date Taking? Authorizing Provider  albuterol (VENTOLIN HFA) 108 (90 Base) MCG/ACT inhaler Inhale 2 puffs into the lungs every 4 (four) hours as needed for up to 5 days for wheezing or shortness of breath. Patient not taking: Reported on 11/24/2022 02/05/20 02/10/20  Defelice, Para March, NP  cetirizine (ZYRTEC ALLERGY) 10 MG tablet Take 1 tablet (10 mg total) by mouth daily. Patient not taking: Reported on 11/24/2022 09/26/19   Darr, Gerilyn Pilgrim, PA-C  doxycycline (VIBRAMYCIN) 100 MG capsule Take 1 capsule (100 mg total) by mouth 2 (two) times daily for 7 days. 02/06/23 02/13/23  Tomi Bamberger, PA-C  EPINEPHrine 0.3 mg/0.3 mL IJ SOAJ injection Inject 0.3 mg into the muscle as needed for anaphylaxis. 11/24/22   Charlynne Pander, MD  EPINEPHrine 0.3 mg/0.3 mL IJ SOAJ injection Use as needed for anaphylaxis 11/24/22   Charlynne Pander, MD  fluticasone [facility]) 50 MCG/ACT nasal spray Place 1 spray into both nostrils daily. Patient not taking: Reported on 11/24/2022 09/26/19   Darr, Gerilyn Pilgrim, PA-C  lisinopril (ZESTRIL) 10 MG tablet TAKE 1 TABLET (10 MG TOTAL) BY MOUTH DAILY. Patient not taking: Reported on 11/24/2022 06/16/20   Dana Allan, MD  lisinopril (ZESTRIL) 10 MG tablet Take 1 tablet (10 mg total) by mouth daily. Patient not taking: Reported on 11/24/2022 01/22/21   Dana Allan, MD  loperamide (IMODIUM) 2 MG capsule Take 1 capsule (2 mg total) by mouth 2 (two) times daily as needed for diarrhea or loose stools. Patient not taking: Reported on 11/24/2022 03/23/21   Wallis Bamberg, PA-C  ondansetron (ZOFRAN-ODT) 8 MG disintegrating tablet Take 1 tablet (8 mg total) by mouth every 8 (eight) hours as needed for nausea or vomiting. Patient not taking: Reported on 11/24/2022 03/23/21   Wallis Bamberg, PA-C  predniSONE (DELTASONE) 20 MG tablet Take 60 mg daily x 2 days then 40 mg daily x 2 days then 20 mg daily x 2 days 11/24/22   Charlynne Pander, MD  sodium chloride (OCEAN) 0.65 % SOLN nasal spray Place 1 spray into both nostrils as needed for congestion. Patient not taking: Reported on 11/24/2022 09/26/19  Darr, Gerilyn Pilgrim, PA-C  UNABLE TO FIND Med Name: Dual action Tylenol/Motrin (Combo).    [provider]    Family History Family History  Problem Relation Age of Onset   Diabetes Mother    Hypertension Mother    Hypertension Father     Social History Social History   Tobacco Use   Smoking status: Former    Types: Cigarettes   Smokeless tobacco: Never  Vaping Use   Vaping status: Never Used  Substance Use Topics   Alcohol use: No   Drug use: Yes    Types: Marijuana     Allergies   Patient has no known allergies.   Review of Systems Review of Systems  All other systems reviewed and are  negative.    Physical Exam Triage Vital Signs ED Triage Vitals  Encounter Vitals Group     BP 02/07/23 1123 (!) 147/91     Systolic BP Percentile --      Diastolic BP Percentile --      Pulse Rate 02/07/23 1123 94     Resp 02/07/23 1123 18     Temp 02/07/23 1123 97.9 F (36.6 C)     Temp Source 02/07/23 1123 Oral     SpO2 02/07/23 1123 98 %     Weight --      Height --      Head Circumference --      Peak Flow --      Pain Score 02/07/23 1125 7     Pain Loc --      Pain Education --      Exclude from Growth Chart --    No data found.  Updated Vital Signs BP (!) 147/91 (BP Location: Left Arm)   Pulse 94   Temp 97.9 F (36.6 C) (Oral)   Resp 18   LMP 01/16/2023 (Approximate)   SpO2 98%   Visual Acuity Right Eye Distance:   Left Eye Distance:   Bilateral Distance:    Right Eye Near:   Left Eye Near:    Bilateral Near:     Physical Exam Vitals and nursing note reviewed.  Constitutional:      Appearance: She is well-developed.  HENT:     Head: Normocephalic.     Comments: Swelling left cheek,  possible early abscess,  not fluctuant.     Mouth/Throat:     Mouth: Mucous membranes are moist.  Eyes:     Comments: Injected bilat conjunctiva   Pulmonary:     Effort: Pulmonary effort is normal.  Abdominal:     General: There is no distension.  Musculoskeletal:        General: Normal range of motion.     Cervical back: Normal range of motion.  Skin:    General: Skin is warm.  Neurological:     Mental Status: She is alert and oriented to person, place, and time.  Psychiatric:        Mood and Affect: Mood normal.      UC Treatments / Results  Labs (all labs ordered are listed, but only abnormal results are displayed) Labs Reviewed - No data to display  EKG   Radiology No results found.  Procedures Procedures (including critical care time)  Medications Ordered in UC Medications  cefTRIAXone (ROCEPHIN) injection 1 g (1 g Intramuscular Given  02/07/23 1215)    Initial Impression / Assessment and Plan / UC Course  I have reviewed the triage vital signs and the nursing notes.  Pertinent labs & imaging results that were available during my care of the patient were reviewed by me and considered in my medical decision making (see chart for details).     Pt given injection of rocephin.   Final Clinical Impressions(s) / UC Diagnoses   Final diagnoses:  Facial swelling     Discharge Instructions      Warm compresses to area of swelling 20 minutes 4 times a day.  Take antibiotic as directed.  Try taking zyrtec otc to help with eye itching    ED Prescriptions   None    PDMP not reviewed this encounter. An After Visit Summary was printed and given to the patient.       Elson Areas, [place] 02/07/23 1226

## 2023-03-01 NOTE — ED Provider Notes (Signed)
[facility]    CSN: [id number] Arrival date & time: 02/06/23  1046      History   Chief Complaint Chief Complaint  Patient presents with   Skin Problem    HPI Kathryn Butler is a 37 y.o. female.   Patient here today for ration of lump to the left side of her face that she first noticed several days ago.  She reports the area is very tender.  She has not had any injury to the area.  She denies any fever.  The history is provided by the patient.    Past Medical History:  Diagnosis Date   Hypertension     Patient Active Problem List   Diagnosis Date Noted   Bronchitis 02/05/2020   Cough 02/05/2020   Smoker 02/05/2020   HTN (hypertension) 11/30/2018   OBESITY, NOS 06/01/2006   ECZEMA, ATOPIC DERMATITIS 06/01/2006   TOBACCO USE, QUIT 06/01/2006    History reviewed. No pertinent surgical history.  OB History   No obstetric history on file.      Home Medications    Prior to Admission medications   Medication Sig Start Date End Date Taking? Authorizing Provider  UNABLE TO FIND Med Name: Dual action Tylenol/Motrin (Combo).   Yes [provider]  albuterol (VENTOLIN HFA) 108 (90 Base) MCG/ACT inhaler Inhale 2 puffs into the lungs every 4 (four) hours as needed for up to 5 days for wheezing or shortness of breath. Patient not taking: Reported on 11/24/2022 02/05/20 02/10/20  Defelice, Para March, NP  cetirizine (ZYRTEC ALLERGY) 10 MG tablet Take 1 tablet (10 mg total) by mouth daily. Patient not taking: Reported on 11/24/2022 09/26/19   Darr, Gerilyn Pilgrim, PA-C  EPINEPHrine 0.3 mg/0.3 mL IJ SOAJ injection Inject 0.3 mg into the muscle as needed for anaphylaxis. 11/24/22   Charlynne Pander, MD  EPINEPHrine 0.3 mg/0.3 mL IJ SOAJ injection Use as needed for anaphylaxis 11/24/22   Charlynne Pander, MD  fluticasone [facility]) 50 MCG/ACT nasal spray Place 1 spray into both nostrils daily. Patient not taking: Reported on 11/24/2022 09/26/19   Darr, Gerilyn Pilgrim, PA-C   lisinopril (ZESTRIL) 10 MG tablet TAKE 1 TABLET (10 MG TOTAL) BY MOUTH DAILY. Patient not taking: Reported on 11/24/2022 06/16/20   Dana Allan, MD  lisinopril (ZESTRIL) 10 MG tablet Take 1 tablet (10 mg total) by mouth daily. Patient not taking: Reported on 11/24/2022 01/22/21   Dana Allan, MD  loperamide (IMODIUM) 2 MG capsule Take 1 capsule (2 mg total) by mouth 2 (two) times daily as needed for diarrhea or loose stools. Patient not taking: Reported on 11/24/2022 03/23/21   Wallis Bamberg, PA-C  ondansetron (ZOFRAN-ODT) 8 MG disintegrating tablet Take 1 tablet (8 mg total) by mouth every 8 (eight) hours as needed for nausea or vomiting. Patient not taking: Reported on 11/24/2022 03/23/21   Wallis Bamberg, PA-C  predniSONE (DELTASONE) 20 MG tablet Take 60 mg daily x 2 days then 40 mg daily x 2 days then 20 mg daily x 2 days 11/24/22   Charlynne Pander, MD  sodium chloride (OCEAN) 0.65 % SOLN nasal spray Place 1 spray into both nostrils as needed for congestion. Patient not taking: Reported on 11/24/2022 09/26/19   Darr, Gerilyn Pilgrim, PA-C    Family History Family History  Problem Relation Age of Onset   Diabetes Mother    Hypertension Mother    Hypertension Father     Social History Social History   Tobacco Use   Smoking status:  Former    Types: Cigarettes   Smokeless tobacco: Never  Vaping Use   Vaping status: Never Used  Substance Use Topics   Alcohol use: No   Drug use: Yes    Types: Marijuana     Allergies   Patient has no known allergies.   Review of Systems Review of Systems  Constitutional:  Negative for chills and fever.  HENT:  Positive for facial swelling.   Eyes:  Negative for discharge and redness.  Gastrointestinal:  Negative for abdominal pain, nausea and vomiting.  Skin:  Positive for color change. Negative for wound.     Physical Exam Triage Vital Signs ED Triage Vitals  Encounter Vitals Group     BP 02/06/23 1104 127/82     Systolic BP Percentile --       Diastolic BP Percentile --      Pulse Rate 02/06/23 1104 83     Resp 02/06/23 1104 18     Temp 02/06/23 1104 97.6 F (36.4 C)     Temp Source 02/06/23 1104 Oral     SpO2 02/06/23 1104 99 %     Weight 02/06/23 1101 180 lb (81.6 kg)     Height 02/06/23 1101 5\' 5"  (1.651 m)     Head Circumference --      Peak Flow --      Pain Score 02/06/23 1101 8     Pain Loc --      Pain Education --      Exclude from Growth Chart --    No data found.  Updated Vital Signs BP 127/82 (BP Location: Left Arm)   Pulse 83   Temp 97.6 F (36.4 C) (Oral)   Resp 18   Ht 5\' 5"  (1.651 m)   Wt 180 lb (81.6 kg)   LMP 01/16/2023 (Approximate)   SpO2 99%   BMI 29.95 kg/m   Visual Acuity Right Eye Distance:   Left Eye Distance:   Bilateral Distance:    Right Eye Near:   Left Eye Near:    Bilateral Near:     Physical Exam Vitals and nursing note reviewed.  Constitutional:      General: She is not in acute distress.    Appearance: Normal appearance. She is not ill-appearing.  HENT:     Head: Normocephalic and atraumatic.  Eyes:     Conjunctiva/sclera: Conjunctivae normal.  Cardiovascular:     Rate and Rhythm: Normal rate.  Pulmonary:     Effort: Pulmonary effort is normal.  Skin:    Comments: Patient with area of induration, swelling and mild erythema to left face just above maxillary.  Neurological:     Mental Status: She is alert.  Psychiatric:        Mood and Affect: Mood normal.        Behavior: Behavior normal.        Thought Content: Thought content normal.      UC Treatments / Results  Labs (all labs ordered are listed, but only abnormal results are displayed) Labs Reviewed - No data to display  EKG   Radiology No results found.  Procedures Procedures (including critical care time)  Medications Ordered in UC Medications - No data to display  Initial Impression / Assessment and Plan / UC Course  I have reviewed the triage vital signs and the nursing  notes.  Pertinent labs & imaging results that were available during my care of the patient were reviewed by me and considered in  my medical decision making (see chart for details).    Will treat to cover facial abscess with strict instruction to report to the emergency dept with any worsening symptoms.  Patient expresses understanding.  Final Clinical Impressions(s) / UC Diagnoses   Final diagnoses:  Abscess   Discharge Instructions   None    ED Prescriptions     Medication Sig Dispense Auth. Provider   doxycycline (VIBRAMYCIN) 100 MG capsule Take 1 capsule (100 mg total) by mouth 2 (two) times daily for 7 days. 14 capsule Tomi Bamberger, PA-C      PDMP not reviewed this encounter.   Tomi Bamberger, PA-C 03/01/23 [phone]

## 2023-10-16 ENCOUNTER — Ambulatory Visit
Admission: EM | Admit: 2023-10-16 | Discharge: 2023-10-16 | Disposition: A | Discharge status: Home / Self Care | Payer: Self-pay | Attending: Physician Assistant | Admitting: Physician Assistant

## 2023-10-16 DIAGNOSIS — R519 Headache, unspecified: Secondary | ICD-10-CM

## 2023-10-16 MED ORDER — KETOROLAC TROMETHAMINE 30 MG/ML IJ SOLN
30.0000 mg | Freq: Once | INTRAMUSCULAR | Status: AC
  Administered 2023-10-16: 30 mg via INTRAMUSCULAR

## 2023-10-16 NOTE — ED Triage Notes (Signed)
 I have a real bad ha that started yesterday, I have a history of high BP but I am not on my medicine right now. Hasn't seen Primary Care in person since 2020.

## 2023-10-19 NOTE — ED Provider Notes (Signed)
 [facility]    CSN: [id number] Arrival date & time: 10/16/23  1128      History   Chief Complaint Chief Complaint  Patient presents with   Headache    HPI Kathryn Butler is a 38 y.o. female.   Patient here today for evaluation of significant headache that started yesterday.  She reports that she does have a history of hypertension but none recently.  She denies any vomiting.  She has not had any vision changes.  The history is provided by the patient.  Headache Associated symptoms: no abdominal pain, no fever, no nausea, no numbness and no vomiting     Past Medical History:  Diagnosis Date   Hypertension     Patient Active Problem List   Diagnosis Date Noted   Bronchitis 02/05/2020   Cough 02/05/2020   Smoker 02/05/2020   HTN (hypertension) 11/30/2018   OBESITY, NOS 06/01/2006   ECZEMA, ATOPIC DERMATITIS 06/01/2006   TOBACCO USE, QUIT 06/01/2006    History reviewed. No pertinent surgical history.  OB History   No obstetric history on file.      Home Medications    Prior to Admission medications   Medication Sig Start Date End Date Taking? Authorizing Provider  albuterol  (VENTOLIN  HFA) 108 (90 Base) MCG/ACT inhaler Inhale 2 puffs into the lungs every 4 (four) hours as needed for up to 5 days for wheezing or shortness of breath. Patient not taking: Reported on 11/24/2022 02/05/20 02/10/20  Defelice, Jeanette, NP  cetirizine  (ZYRTEC  ALLERGY) 10 MG tablet Take 1 tablet (10 mg total) by mouth daily. Patient not taking: Reported on 11/24/2022 09/26/19   Darr, Lang, PA-C  EPINEPHrine  0.3 mg/0.3 mL IJ SOAJ injection Inject 0.3 mg into the muscle as needed for anaphylaxis. 11/24/22   Patt Alm Macho, MD  EPINEPHrine  0.3 mg/0.3 mL IJ SOAJ injection Use as needed for anaphylaxis 11/24/22   Patt Alm Macho, MD  fluticasone  (FLONASE ) 50 MCG/ACT nasal spray Place 1 spray into both nostrils daily. Patient not taking: Reported on 11/24/2022 09/26/19   Darr,  Jacob, PA-C  lisinopril  (ZESTRIL ) 10 MG tablet TAKE 1 TABLET (10 MG TOTAL) BY MOUTH DAILY. Patient not taking: Reported on 11/24/2022 06/16/20   Hope Merle, MD  lisinopril  (ZESTRIL ) 10 MG tablet Take 1 tablet (10 mg total) by mouth daily. Patient not taking: Reported on 11/24/2022 01/22/21   Hope Merle, MD  loperamide  (IMODIUM ) 2 MG capsule Take 1 capsule (2 mg total) by mouth 2 (two) times daily as needed for diarrhea or loose stools. Patient not taking: Reported on 11/24/2022 03/23/21   Christopher Savannah, PA-C  ondansetron  (ZOFRAN -ODT) 8 MG disintegrating tablet Take 1 tablet (8 mg total) by mouth every 8 (eight) hours as needed for nausea or vomiting. Patient not taking: Reported on 11/24/2022 03/23/21   Christopher Savannah, PA-C  predniSONE  (DELTASONE ) 20 MG tablet Take 60 mg daily x 2 days then 40 mg daily x 2 days then 20 mg daily x 2 days 11/24/22   Patt Alm Macho, MD  sodium chloride  (OCEAN) 0.65 % SOLN nasal spray Place 1 spray into both nostrils as needed for congestion. Patient not taking: Reported on 11/24/2022 09/26/19   Darr, Lang, PA-C  UNABLE TO FIND Med Name: Dual action Tylenol /Motrin  (Combo).    [provider]    Family History Family History  Problem Relation Age of Onset   Diabetes Mother    Hypertension Mother    Hypertension Father     Social History  Social History   Tobacco Use   Smoking status: Former    Types: Cigarettes   Smokeless tobacco: Never  Vaping Use   Vaping status: Never Used  Substance Use Topics   Alcohol use: No   Drug use: Yes    Types: Marijuana     Allergies   Patient has no known allergies.   Review of Systems Review of Systems  Constitutional:  Negative for chills and fever.  Eyes:  Negative for discharge, redness and visual disturbance.  Respiratory:  Negative for shortness of breath.   Gastrointestinal:  Negative for abdominal pain, nausea and vomiting.  Neurological:  Positive for headaches. Negative for numbness.      Physical Exam Triage Vital Signs ED Triage Vitals  Encounter Vitals Group     BP 10/16/23 1141 (!) 142/90     Girls Systolic BP Percentile --      Girls Diastolic BP Percentile --      Boys Systolic BP Percentile --      Boys Diastolic BP Percentile --      Pulse Rate 10/16/23 1141 81     Resp 10/16/23 1141 20     Temp 10/16/23 1141 97.7 F (36.5 C)     Temp Source 10/16/23 1141 Temporal     SpO2 10/16/23 1141 97 %     Weight 10/16/23 1140 179 lb 14.3 oz (81.6 kg)     Height 10/16/23 1140 5' 5 (1.651 m)     Head Circumference --      Peak Flow --      Pain Score 10/16/23 1136 0     Pain Loc --      Pain Education --      Exclude from Growth Chart --    No data found.  Updated Vital Signs BP (!) 142/90 (BP Location: Left Arm)   Pulse 81   Temp 97.7 F (36.5 C) (Temporal)   Resp 20   Ht 5' 5 (1.651 m)   Wt 179 lb 14.3 oz (81.6 kg)   LMP 10/03/2023 (Approximate)   SpO2 97%   BMI 29.94 kg/m   Visual Acuity Right Eye Distance:   Left Eye Distance:   Bilateral Distance:    Right Eye Near:   Left Eye Near:    Bilateral Near:     Physical Exam Vitals and nursing note reviewed.  Constitutional:      General: She is not in acute distress.    Appearance: Normal appearance. She is not ill-appearing.  HENT:     Head: Normocephalic and atraumatic.  Eyes:     Extraocular Movements: Extraocular movements intact.     Conjunctiva/sclera: Conjunctivae normal.     Pupils: Pupils are equal, round, and reactive to light.  Cardiovascular:     Rate and Rhythm: Normal rate and regular rhythm.  Pulmonary:     Effort: Pulmonary effort is normal. No respiratory distress.     Breath sounds: No wheezing, rhonchi or rales.  Neurological:     Mental Status: She is alert.     Comments: No facial droop, normal speech, normal finger-to-nose and heel-to-shin.  Psychiatric:        Mood and Affect: Mood normal.        Behavior: Behavior normal.        Thought Content:  Thought content normal.      UC Treatments / Results  Labs (all labs ordered are listed, but only abnormal results are displayed) Labs Reviewed - No data  to display  EKG   Radiology No results found.  Procedures Procedures (including critical care time)  Medications Ordered in UC Medications  ketorolac  (TORADOL ) 30 MG/ML injection 30 mg (30 mg Intramuscular Given 10/16/23 1226)    Initial Impression / Assessment and Plan / UC Course  I have reviewed the triage vital signs and the nursing notes.  Pertinent labs & imaging results that were available during my care of the patient were reviewed by me and considered in my medical decision making (see chart for details).    Will trial Toradol  for headache.  Recommended further evaluation if no gradual improvement or with any worsening symptoms.  Advised emergency room visit with any worsening headache.  Final Clinical Impressions(s) / UC Diagnoses   Final diagnoses:  Acute nonintractable headache, unspecified headache type   Discharge Instructions   None    ED Prescriptions   None    PDMP not reviewed this encounter.   Billy Asberry FALCON, PA-C 10/19/23 1759
